# Patient Record
Sex: Male | Born: 1970 | Race: White | Hispanic: No | Smoking: Current every day smoker
Health system: Southern US, Community
[De-identification: ages and names within clinical notes are randomized; demographics above are authoritative.]

## PROBLEM LIST (undated history)

## (undated) DIAGNOSIS — F1910 Other psychoactive substance abuse, uncomplicated: Secondary | ICD-10-CM

## (undated) DIAGNOSIS — R079 Chest pain, unspecified: Secondary | ICD-10-CM

## (undated) DIAGNOSIS — I219 Acute myocardial infarction, unspecified: Secondary | ICD-10-CM

## (undated) DIAGNOSIS — R55 Syncope and collapse: Secondary | ICD-10-CM

---

## 1998-11-10 ENCOUNTER — Emergency Department (HOSPITAL_COMMUNITY): Admission: EM | Admit: 1998-11-10 | Discharge: 1998-11-10 | Payer: Self-pay | Admitting: Emergency Medicine

## 1998-11-10 ENCOUNTER — Encounter: Payer: Self-pay | Admitting: Emergency Medicine

## 1999-04-14 ENCOUNTER — Encounter: Payer: Self-pay | Admitting: Family Medicine

## 1999-04-14 ENCOUNTER — Ambulatory Visit (HOSPITAL_COMMUNITY): Admission: RE | Admit: 1999-04-14 | Discharge: 1999-04-14 | Payer: Self-pay | Admitting: *Deleted

## 2002-02-12 ENCOUNTER — Emergency Department (HOSPITAL_COMMUNITY): Admission: EM | Admit: 2002-02-12 | Discharge: 2002-02-12 | Payer: Self-pay | Admitting: Emergency Medicine

## 2002-11-27 ENCOUNTER — Emergency Department (HOSPITAL_COMMUNITY): Admission: EM | Admit: 2002-11-27 | Discharge: 2002-11-27 | Payer: Self-pay | Admitting: Emergency Medicine

## 2003-01-30 ENCOUNTER — Emergency Department (HOSPITAL_COMMUNITY): Admission: EM | Admit: 2003-01-30 | Discharge: 2003-01-30 | Payer: Self-pay | Admitting: Emergency Medicine

## 2003-01-30 ENCOUNTER — Encounter: Payer: Self-pay | Admitting: Emergency Medicine

## 2004-12-10 ENCOUNTER — Emergency Department (HOSPITAL_COMMUNITY): Admission: EM | Admit: 2004-12-10 | Discharge: 2004-12-10 | Payer: Self-pay | Admitting: Emergency Medicine

## 2006-12-26 ENCOUNTER — Emergency Department (HOSPITAL_COMMUNITY): Admission: EM | Admit: 2006-12-26 | Discharge: 2006-12-27 | Payer: Self-pay | Admitting: Emergency Medicine

## 2006-12-28 ENCOUNTER — Encounter: Payer: Self-pay | Admitting: Emergency Medicine

## 2006-12-29 ENCOUNTER — Inpatient Hospital Stay (HOSPITAL_COMMUNITY): Admission: EM | Admit: 2006-12-29 | Discharge: 2006-12-29 | Payer: Self-pay | Admitting: General Surgery

## 2008-07-12 IMAGING — CR DG CHEST 2V
3 series · 3 of 3 positions shown · non-contrast
Comparison: CT of 12/26/06.

CLINICAL DATA: 35 year-old with chest pain. Mid to right-sided chest pain, shortness of breath. Coughing up blood.
 CHEST - 2 VIEW:

[w chest lat (1 of 2)]
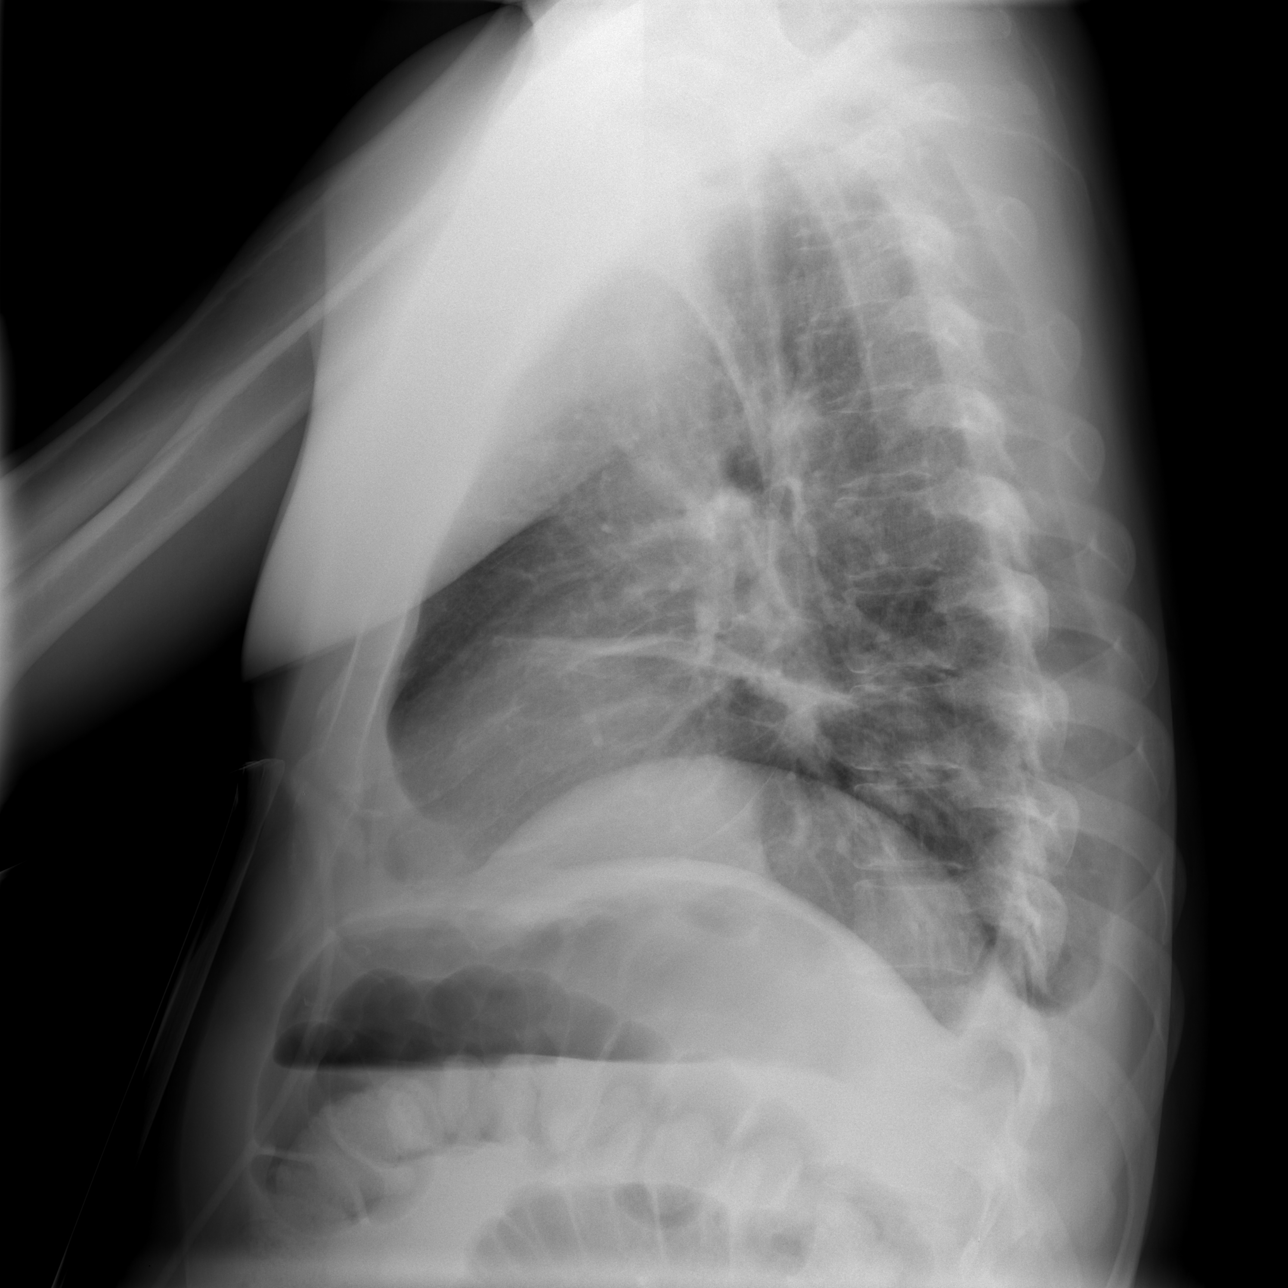

[w chest lat (2 of 2)]
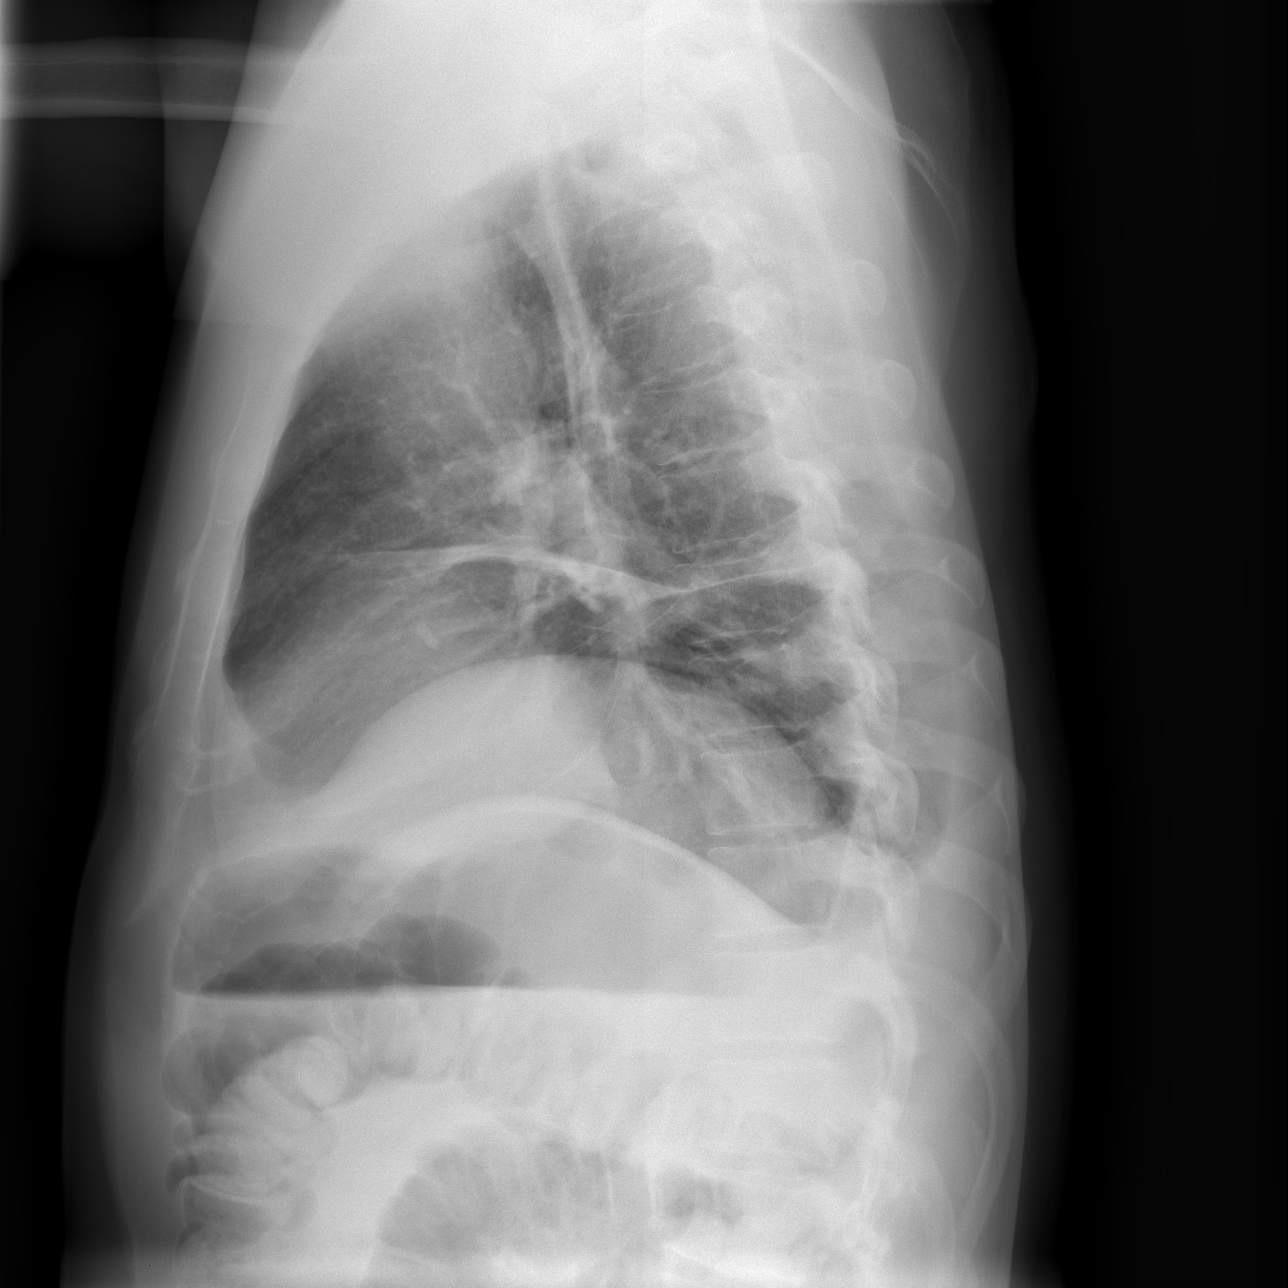

[view not recorded]
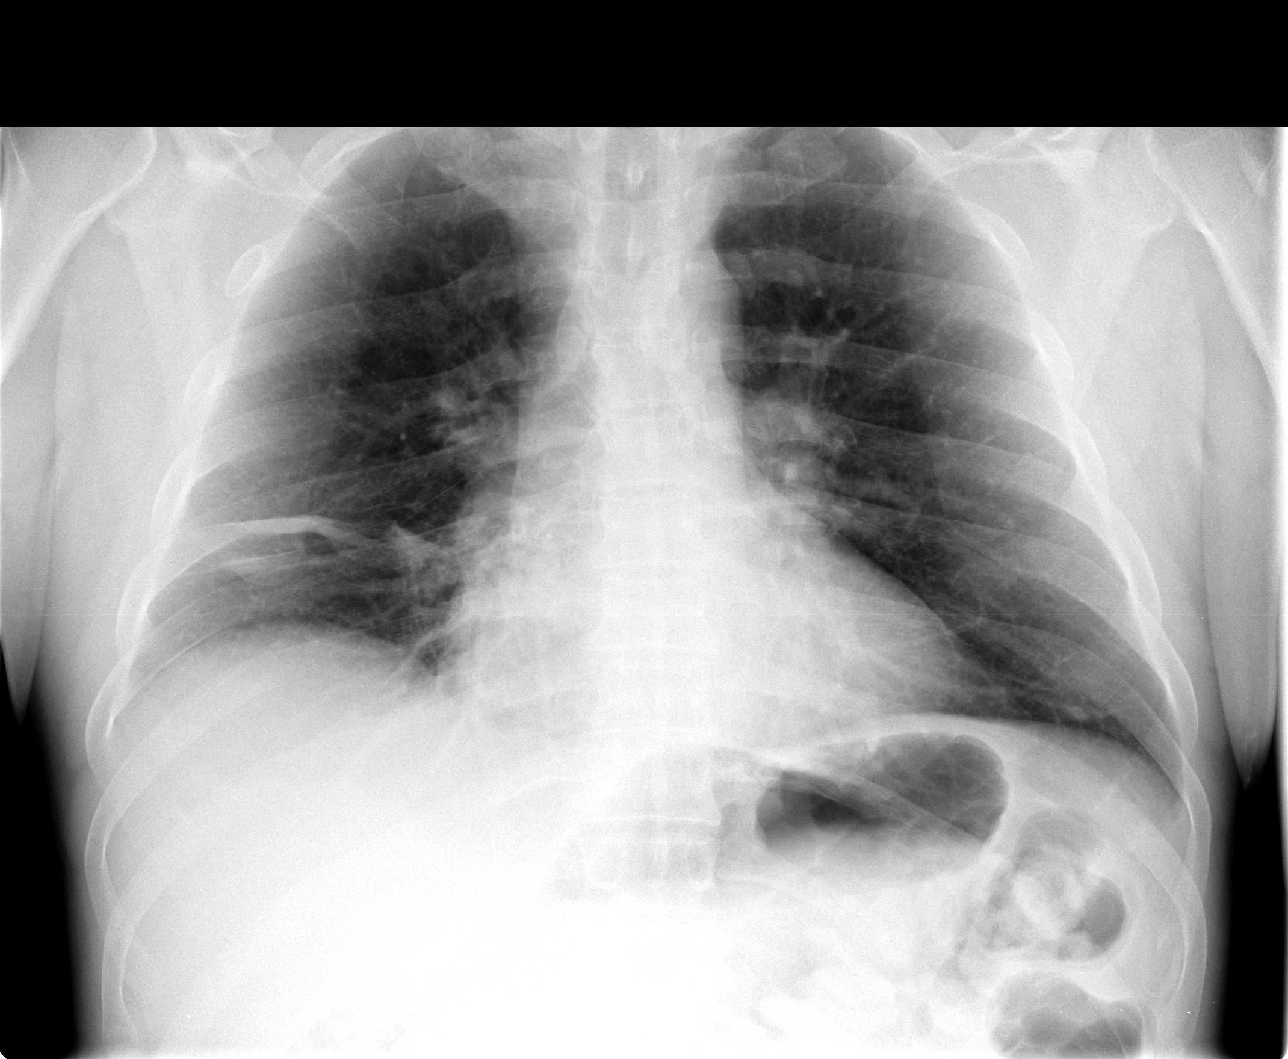

[3 of 3 positions shown; findings below may reference images not displayed]

FINDINGS: There is a fracture of the left anterior 1st rib.  There is no evidence for pneumothorax. There is significant right lung base subsegmental atelectasis. To a lesser degree, there is minimal left base atelectasis.
IMPRESSION: 1.  Fracture of the left anterior 1st rib.
 2.  Right lung base atelectasis.

## 2009-11-19 ENCOUNTER — Emergency Department (HOSPITAL_COMMUNITY): Admission: EM | Admit: 2009-11-19 | Discharge: 2009-11-19 | Payer: Self-pay | Admitting: Emergency Medicine

## 2010-03-21 ENCOUNTER — Emergency Department (HOSPITAL_COMMUNITY): Admission: EM | Admit: 2010-03-21 | Discharge: 2010-03-21 | Payer: Self-pay | Admitting: Emergency Medicine

## 2010-07-10 LAB — CBC
HCT: 45.5 % (ref 39.0–52.0)
MCH: 33.4 pg (ref 26.0–34.0)

## 2010-07-10 LAB — URINALYSIS, ROUTINE W REFLEX MICROSCOPIC
Ketones, ur: NEGATIVE mg/dL
Urobilinogen, UA: 2 mg/dL — ABNORMAL HIGH (ref 0.0–1.0)

## 2010-07-10 LAB — DIFFERENTIAL
Basophils Absolute: 0 10*3/uL (ref 0.0–0.1)
Eosinophils Relative: 1 % (ref 0–5)
Lymphs Abs: 1.5 10*3/uL (ref 0.7–4.0)

## 2010-07-10 LAB — BASIC METABOLIC PANEL
BUN: 13 mg/dL (ref 6–23)
CO2: 21 mEq/L (ref 19–32)
Glucose, Bld: 97 mg/dL (ref 70–99)
Potassium: 3.6 mEq/L (ref 3.5–5.1)

## 2010-09-07 NOTE — Discharge Summary (Signed)
Chase [name], Chase [name]               ACCOUNT NO.:  [id number]   MEDICAL RECORD NO.:  [id number]          PATIENT TYPE:  INP   LOCATION:  5707                         FACILITY:  [facility]   PHYSICIAN:  Velora Heckler, MD      DATE OF BIRTH:  15-Mar-1971   DATE OF ADMISSION:  12/29/2006  DATE OF DISCHARGE:                               DISCHARGE SUMMARY   DISCHARGE DIAGNOSES:  1. Status post crush injury to chest.  2. Left first rib fracture.  3. Left sternoclavicular dislocation.  4. History tobacco abuse.   HISTORY OF ADMISSION:  This is an otherwise healthy 40 year old male who  was working on a pickup on December 26, 2006, when it fell off the jack  and struck him in his chest.  His initial workup in the emergency room  showed a sternoclavicular dislocation on the left and a left first rib  fracture but no pneumothorax, and the patient was discharged home with  pain medication.  He returned to the emergency room, however, on the  evening of December 28, 2006, with continued complaints of pain not  relieved by the medications he was discharged on, concerns that he is  having some hemoptysis and also some hematuria.  He was seen by Dr.  Janee Morn and admitted for pain control and mobilization.  He had a  urinalysis done which was negative.  He had a chest x-ray which showed  no pneumothorax and some minimal atelectasis.  His hemoglobin/hematocrit  were stable at 14.1 and 40.1.  His white blood cell count was 9800 and  platelets were 268,000.  The patient was mobilized and his pain  medication was increased.  He did have a productive green sputum  production with his cough and was started empirically on Avelox for  bronchitis/early community-acquired pneumonia.  As noted, he remains  afebrile and does not have any significant leukocytosis.   Again, the patient was mobilized.  He is tolerating a regular diet and  at this point his pain is controlled, and he is discharged home with   family.   MEDICATIONS AT THE TIME OF DISCHARGE:  1. Percocet 10/325 one to two p.o. q.4h. p.r.n. pain #80, no refill.  2. Robaxin 500 mg one to two p.o. q.6h. p.r.n. muscle spasms #90, no      refill.  3. Phenergan 12.5 mg one tablet q.6h. p.r.n. nausea/vomiting #30, no      refill.  4. Avelox 400 mg one p.o. daily x4 more days.   The patient will be seen in followup in trauma clinic on January 11, 2007, 2:00 p.m.   DIET:  Regular.   ESTIMATE RETURN TO WORK:  In 3-4 weeks.  He does work as a Copywriter, advertising.      Shawn Rayburn, P.A.      Velora Heckler, MD  Electronically Signed    SR/MEDQ  D:  12/29/2006  T:  12/29/2006  Job:  [id number]   cc:   [facility]

## 2010-11-28 ENCOUNTER — Emergency Department (HOSPITAL_COMMUNITY): Payer: Worker's Compensation

## 2010-11-28 ENCOUNTER — Emergency Department (HOSPITAL_COMMUNITY)
Admission: EM | Admit: 2010-11-28 | Discharge: 2010-11-28 | Disposition: A | Discharge status: Home / Self Care | Payer: Worker's Compensation | Attending: Emergency Medicine | Admitting: Emergency Medicine

## 2010-11-28 DIAGNOSIS — M62838 Other muscle spasm: Secondary | ICD-10-CM | POA: Insufficient documentation

## 2010-11-28 DIAGNOSIS — R109 Unspecified abdominal pain: Secondary | ICD-10-CM | POA: Insufficient documentation

## 2010-11-28 DIAGNOSIS — T07XXXA Unspecified multiple injuries, initial encounter: Secondary | ICD-10-CM | POA: Insufficient documentation

## 2010-11-28 DIAGNOSIS — R10811 Right upper quadrant abdominal tenderness: Secondary | ICD-10-CM | POA: Insufficient documentation

## 2010-11-28 DIAGNOSIS — R071 Chest pain on breathing: Secondary | ICD-10-CM | POA: Insufficient documentation

## 2010-11-28 LAB — COMPREHENSIVE METABOLIC PANEL WITH GFR
ALT: 12 U/L (ref 0–53)
AST: 15 U/L (ref 0–37)
Albumin: 3.7 g/dL (ref 3.5–5.2)
Alkaline Phosphatase: 55 U/L (ref 39–117)
BUN: 13 mg/dL (ref 6–23)
CO2: 25 meq/L (ref 19–32)
Calcium: 8.7 mg/dL (ref 8.4–10.5)
Chloride: 106 meq/L (ref 96–112)
Creatinine, Ser: 0.82 mg/dL (ref 0.50–1.35)
GFR calc Af Amer: >60 mL/min
GFR calc non Af Amer: >60 mL/min
Glucose, Bld: 95 mg/dL (ref 70–99)
Potassium: 3.9 meq/L (ref 3.5–5.1)
Sodium: 138 meq/L (ref 135–145)
Total Bilirubin: 0.4 mg/dL (ref 0.3–1.2)
Total Protein: 6.2 g/dL (ref 6.0–8.3)

## 2010-11-28 LAB — DIFFERENTIAL
Basophils Absolute: 0 10*3/uL (ref 0.0–0.1)
Eosinophils Absolute: 0.3 10*3/uL (ref 0.0–0.7)
Lymphocytes Relative: 25 % (ref 12–46)
Monocytes Relative: 7 % (ref 3–12)
Neutrophils Relative %: 64 % (ref 43–77)

## 2010-11-28 LAB — CBC
HCT: 41.8 % (ref 39.0–52.0)
Hemoglobin: 15.1 g/dL (ref 13.0–17.0)
MCH: 33.5 pg (ref 26.0–34.0)
MCHC: 36.1 g/dL — ABNORMAL HIGH (ref 30.0–36.0)
MCV: 92.7 fL (ref 78.0–100.0)
Platelets: 246 K/uL (ref 150–400)
RBC: 4.51 MIL/uL (ref 4.22–5.81)
RDW: 12.8 % (ref 11.5–15.5)
WBC: 6.4 K/uL (ref 4.0–10.5)

## 2010-11-28 LAB — PROTIME-INR
INR: 0.91 (ref 0.00–1.49)
Prothrombin Time: 12.5 s (ref 11.6–15.2)

## 2010-11-28 MED ORDER — IOHEXOL 300 MG/ML  SOLN
75.0000 mL | Freq: Once | INTRAMUSCULAR | Status: AC | PRN
  Administered 2010-11-28: 75 mL via INTRAVENOUS

## 2010-12-06 ENCOUNTER — Emergency Department (HOSPITAL_COMMUNITY): Payer: Worker's Compensation

## 2010-12-06 ENCOUNTER — Emergency Department (HOSPITAL_COMMUNITY)
Admission: EM | Admit: 2010-12-06 | Discharge: 2010-12-06 | Disposition: A | Discharge status: Home / Self Care | Payer: Worker's Compensation | Attending: Emergency Medicine | Admitting: Emergency Medicine

## 2010-12-06 DIAGNOSIS — W1789XA Other fall from one level to another, initial encounter: Secondary | ICD-10-CM | POA: Insufficient documentation

## 2010-12-06 DIAGNOSIS — R079 Chest pain, unspecified: Secondary | ICD-10-CM | POA: Insufficient documentation

## 2010-12-15 ENCOUNTER — Emergency Department (HOSPITAL_COMMUNITY)
Admission: EM | Admit: 2010-12-15 | Discharge: 2010-12-15 | Disposition: A | Discharge status: Home / Self Care | Payer: Worker's Compensation | Attending: Emergency Medicine | Admitting: Emergency Medicine

## 2010-12-15 DIAGNOSIS — S199XXA Unspecified injury of neck, initial encounter: Secondary | ICD-10-CM | POA: Insufficient documentation

## 2010-12-15 DIAGNOSIS — S0993XA Unspecified injury of face, initial encounter: Secondary | ICD-10-CM | POA: Insufficient documentation

## 2010-12-15 DIAGNOSIS — Y990 Civilian activity done for income or pay: Secondary | ICD-10-CM | POA: Insufficient documentation

## 2010-12-15 DIAGNOSIS — R071 Chest pain on breathing: Secondary | ICD-10-CM | POA: Insufficient documentation

## 2010-12-15 DIAGNOSIS — W19XXXA Unspecified fall, initial encounter: Secondary | ICD-10-CM | POA: Insufficient documentation

## 2010-12-27 ENCOUNTER — Emergency Department (HOSPITAL_COMMUNITY)
Admission: EM | Admit: 2010-12-27 | Discharge: 2010-12-27 | Disposition: A | Discharge status: Home / Self Care | Payer: Worker's Compensation | Attending: Emergency Medicine | Admitting: Emergency Medicine

## 2010-12-27 DIAGNOSIS — R071 Chest pain on breathing: Secondary | ICD-10-CM | POA: Insufficient documentation

## 2010-12-27 DIAGNOSIS — Z09 Encounter for follow-up examination after completed treatment for conditions other than malignant neoplasm: Secondary | ICD-10-CM | POA: Insufficient documentation

## 2011-02-04 LAB — URINALYSIS, ROUTINE W REFLEX MICROSCOPIC
Bilirubin Urine: NEGATIVE
Bilirubin Urine: NEGATIVE
Glucose, UA: NEGATIVE
Hgb urine dipstick: NEGATIVE
Ketones, ur: NEGATIVE
Nitrite: NEGATIVE
Nitrite: NEGATIVE
Specific Gravity, Urine: 1.024
Urobilinogen, UA: 0.2
pH: 7

## 2011-02-04 LAB — COMPREHENSIVE METABOLIC PANEL
ALT: 16
AST: 19
Alkaline Phosphatase: 45
Chloride: 108
Creatinine, Ser: 0.99
GFR calc non Af Amer: >60
Sodium: 139
Total Bilirubin: 0.7
Total Protein: 6

## 2011-02-04 LAB — TROPONIN I: Troponin I: 0.03

## 2011-02-04 LAB — CBC
HCT: 41.3
HCT: 43.2
Hemoglobin: 14.1
Hemoglobin: 14.8
MCHC: 34.3
Platelets: 287
RBC: 4.28
RDW: 12.8

## 2011-02-04 LAB — DIFFERENTIAL
Basophils Absolute: 0.1
Eosinophils Absolute: 0.3
Eosinophils Relative: 2
Lymphocytes Relative: 15
Lymphs Abs: 1.9
Lymphs Abs: 1.5
Monocytes Absolute: 0.5
Monocytes Relative: 6
Neutrophils Relative %: 70

## 2011-02-04 LAB — CK TOTAL AND CKMB (NOT AT ARMC)
Relative Index: 1.4
Relative Index: 1.1
Total CK: 145

## 2011-02-04 LAB — BASIC METABOLIC PANEL
CO2: 24
Glucose, Bld: 108 — ABNORMAL HIGH
Potassium: 3.4 — ABNORMAL LOW
Sodium: 137

## 2011-02-04 LAB — RAPID URINE DRUG SCREEN, HOSP PERFORMED
Amphetamines: NOT DETECTED
Tetrahydrocannabinol: POSITIVE — AB

## 2012-06-20 IMAGING — CR DG CHEST 2V
2 series · 2 of 2 positions shown · non-contrast
Comparison: 11/28/2010; chest CT - 11/28/2010

CLINICAL DATA: Right-sided chest pain, history of prior right-sided
rib fractures.

CHEST - 2 VIEW

[w chest pa]
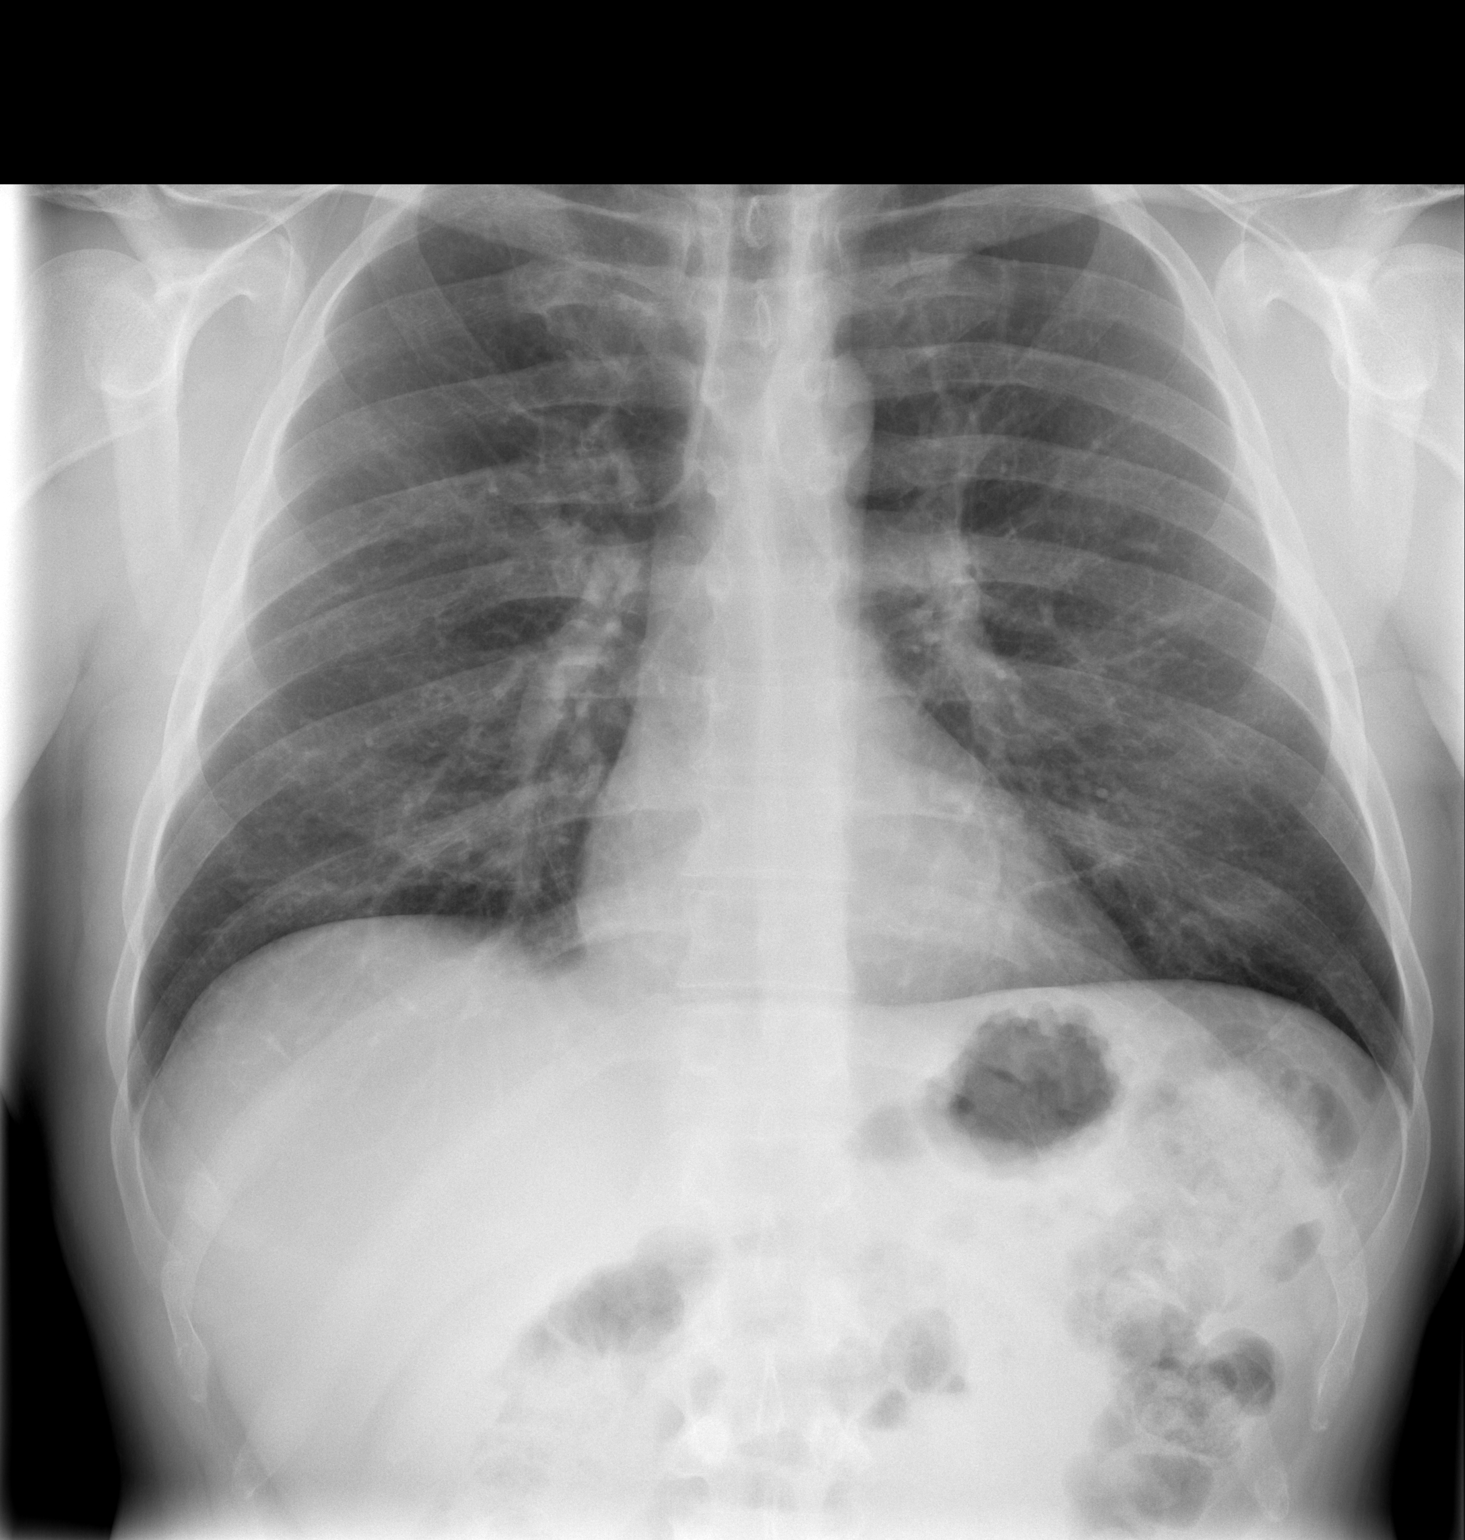

[w chest lat]
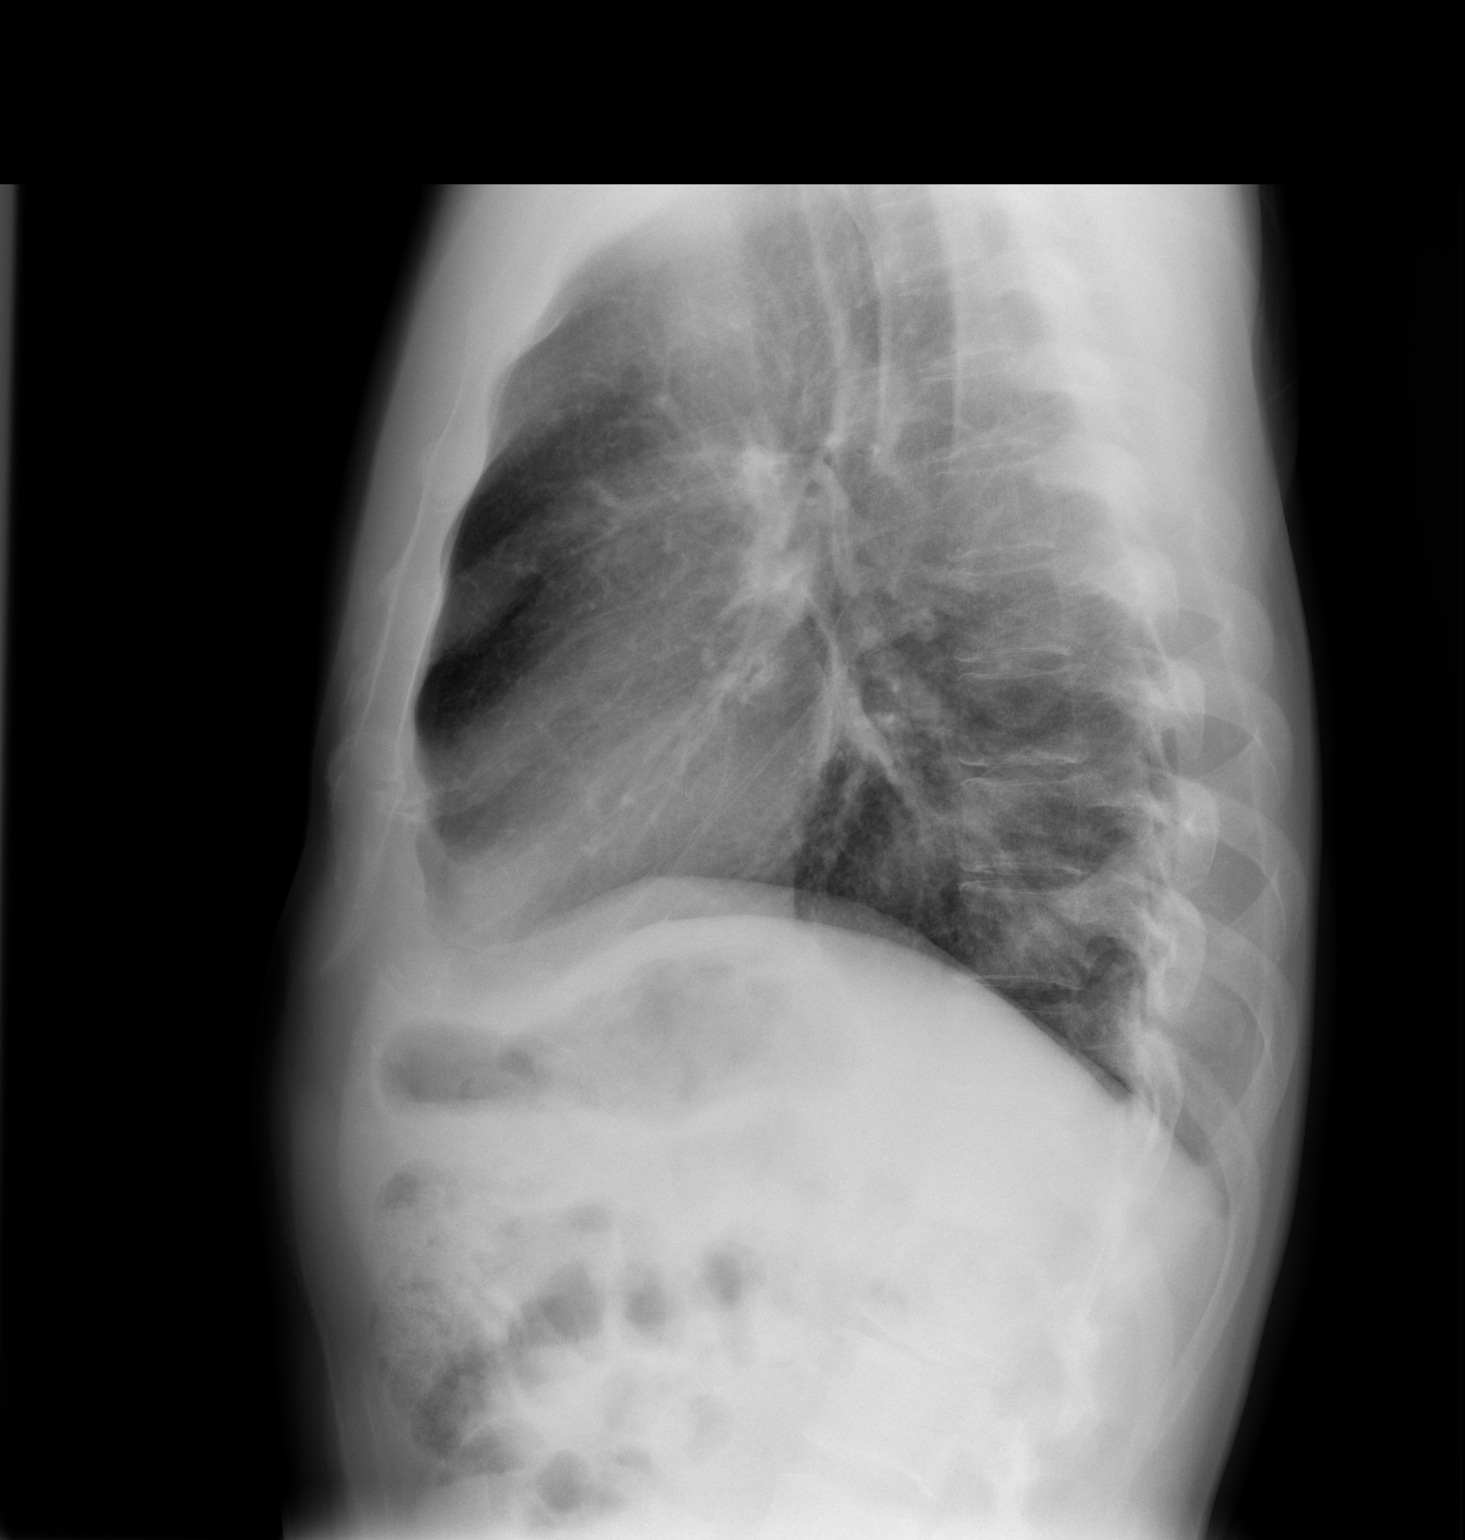

[2 of 2 positions shown; findings below may reference images not displayed]

FINDINGS: Unchanged cardiac silhouette and mediastinal contours.
No focal parenchymal opacities.  No pleural effusion or
pneumothorax.  Unchanged bones, specifically, no displaced right-
sided rib fractures.  Redemonstrated mild accentuation of lower
thoracic/upper Stich kyphosis without definite anterior compression
deformity.
IMPRESSION: No acute cardiopulmonary disease, specifically, no displaced right-
sided rib fractures.

## 2014-11-10 ENCOUNTER — Encounter (HOSPITAL_COMMUNITY): Payer: Self-pay | Admitting: *Deleted

## 2014-11-10 ENCOUNTER — Emergency Department (HOSPITAL_COMMUNITY): Payer: Worker's Compensation

## 2014-11-10 ENCOUNTER — Emergency Department (HOSPITAL_COMMUNITY)
Admission: EM | Admit: 2014-11-10 | Discharge: 2014-11-10 | Disposition: A | Discharge status: Home / Self Care | Payer: Worker's Compensation | Attending: Emergency Medicine | Admitting: Emergency Medicine

## 2014-11-10 DIAGNOSIS — R42 Dizziness and giddiness: Secondary | ICD-10-CM | POA: Insufficient documentation

## 2014-11-10 DIAGNOSIS — H6011 Cellulitis of right external ear: Secondary | ICD-10-CM

## 2014-11-10 DIAGNOSIS — R110 Nausea: Secondary | ICD-10-CM | POA: Insufficient documentation

## 2014-11-10 DIAGNOSIS — Z720 Tobacco use: Secondary | ICD-10-CM | POA: Insufficient documentation

## 2014-11-10 LAB — CBC WITH DIFFERENTIAL/PLATELET
BASOS ABS: 0 10*3/uL (ref 0.0–0.1)
Basophils Relative: 0 % (ref 0–1)
EOS ABS: 0.3 10*3/uL (ref 0.0–0.7)
EOS PCT: 4 % (ref 0–5)
HCT: 44.4 % (ref 39.0–52.0)
Hemoglobin: 15.3 g/dL (ref 13.0–17.0)
Lymphocytes Relative: 23 % (ref 12–46)
Lymphs Abs: 1.7 10*3/uL (ref 0.7–4.0)
MCH: 32.1 pg (ref 26.0–34.0)
MCHC: 34.5 g/dL (ref 30.0–36.0)
MCV: 93.3 fL (ref 78.0–100.0)
MONOS PCT: 6 % (ref 3–12)
Monocytes Absolute: 0.5 10*3/uL (ref 0.1–1.0)
NEUTROS PCT: 67 % (ref 43–77)
Neutro Abs: 4.8 10*3/uL (ref 1.7–7.7)
PLATELETS: 261 10*3/uL (ref 150–400)
RBC: 4.76 MIL/uL (ref 4.22–5.81)
RDW: 13.4 % (ref 11.5–15.5)
WBC: 7.3 10*3/uL (ref 4.0–10.5)

## 2014-11-10 LAB — BASIC METABOLIC PANEL
Anion gap: 7 (ref 5–15)
BUN: 13 mg/dL (ref 6–20)
CALCIUM: 9 mg/dL (ref 8.9–10.3)
CHLORIDE: 104 mmol/L (ref 101–111)
CO2: 26 mmol/L (ref 22–32)
CREATININE: 1.03 mg/dL (ref 0.61–1.24)
GLUCOSE: 97 mg/dL (ref 65–99)
POTASSIUM: 3.9 mmol/L (ref 3.5–5.1)
Sodium: 137 mmol/L (ref 135–145)

## 2014-11-10 MED ORDER — OXYCODONE-ACETAMINOPHEN 5-325 MG PO TABS: 1.0000 | ORAL_TABLET | Freq: Four times a day (QID) | ORAL | Status: DC | PRN

## 2014-11-10 MED ORDER — IOHEXOL 300 MG/ML  SOLN
75.0000 mL | Freq: Once | INTRAMUSCULAR | Status: AC | PRN
  Administered 2014-11-10: 75 mL via INTRAVENOUS

## 2014-11-10 MED ORDER — CEPHALEXIN 500 MG PO CAPS
500.0000 mg | ORAL_CAPSULE | Freq: Four times a day (QID) | ORAL | Status: DC
Start: 2014-11-10 — End: 2015-10-11

## 2014-11-10 MED ORDER — SULFAMETHOXAZOLE-TRIMETHOPRIM 800-160 MG PO TABS: 1.0000 | ORAL_TABLET | Freq: Two times a day (BID) | ORAL | Status: AC

## 2014-11-10 MED ORDER — OXYCODONE-ACETAMINOPHEN 5-325 MG PO TABS
2.0000 | ORAL_TABLET | Freq: Once | ORAL | Status: AC
  Administered 2014-11-10: 2 via ORAL
  Filled 2014-11-10: qty 2

## 2014-11-10 MED ORDER — FENTANYL CITRATE (PF) 100 MCG/2ML IJ SOLN: 50.0000 ug | Freq: Once | INTRAMUSCULAR | Status: DC

## 2014-11-10 MED ORDER — IBUPROFEN 800 MG PO TABS: 800.0000 mg | ORAL_TABLET | Freq: Three times a day (TID) | ORAL | Status: DC

## 2014-11-10 NOTE — ED Notes (Signed)
CT called and verified about patient's CT.

## 2014-11-10 NOTE — ED Provider Notes (Signed)
CSN: [id number]     Arrival date & time 11/10/14  1545 History  This chart was scribed for Chase ForthHannah Maxson Oddo, PA-C, working with Derwood KaplanAnkit Nanavati, MD by Elon SpannerGarrett Cook, ED Scribe. This patient was seen in room TR07C/TR07C and the patient's care was started at 4:33 PM.   Chief Complaint  Patient presents with  . Otalgia   The history is provided by the patient. No language interpreter was used.   HPI Comments: Chase [name] is a 44 y.o. male who presents to the Emergency Department complaining of worsening, severe pain in and around the right ear.  He also reports associated purulent/bloody drainage from the helix of the ear and phonophobia.  Additionally, he has had some lightheadedness and nausea onset this morning.  The pain is worsened with light touch and chewing.  He has taken aleve without relief.  The week prior the patient had an area of swelling and warmth on the right occipital region that resolved independently.  The patient has not been swimming recently, denies increased sun exposure.  There is no history of chronic medical conditions and takes no medications regularly.  He denies fever.     History reviewed. No pertinent past medical history. History reviewed. No pertinent past surgical history. No family history on file. History  Substance Use Topics  . Smoking status: Current Every Day Smoker -- 1.00 packs/day    Types: Cigarettes  . Smokeless tobacco: Not on file  . Alcohol Use: Yes    Review of Systems  Constitutional: Negative for fever, diaphoresis, appetite change, fatigue and unexpected weight change.  HENT: Positive for ear pain. Negative for mouth sores.   Eyes: Negative for visual disturbance.  Respiratory: Negative for cough, chest tightness, shortness of breath and wheezing.   Cardiovascular: Negative for chest pain.  Gastrointestinal: Positive for nausea. Negative for vomiting, abdominal pain, diarrhea and constipation.  Endocrine: Negative for polydipsia,  polyphagia and polyuria.  Genitourinary: Negative for dysuria, urgency, frequency and hematuria.  Musculoskeletal: Negative for back pain and neck stiffness.  Skin: Negative for rash.  Allergic/Immunologic: Negative for immunocompromised state.  Neurological: Positive for light-headedness. Negative for syncope and headaches.  Hematological: Does not bruise/bleed easily.  Psychiatric/Behavioral: Negative for sleep disturbance. The patient is not nervous/anxious.       Allergies  Review of patient's allergies indicates no known allergies.  Home Medications   Prior to Admission medications   Medication Sig Start Date End Date Taking? Authorizing Provider  cephALEXin (KEFLEX) 500 MG capsule Take 1 capsule (500 mg total) by mouth 4 (four) times daily. 11/10/14   Ryleigh Esqueda, PA-C  ibuprofen (ADVIL,MOTRIN) 800 MG tablet Take 1 tablet (800 mg total) by mouth 3 (three) times daily. 11/10/14   Kameren Baade, PA-C  oxyCODONE-acetaminophen (PERCOCET) 5-325 MG per tablet Take 1-2 tablets by mouth every 6 (six) hours as needed. 11/10/14   Ariyel Jeangilles, PA-C  sulfamethoxazole-trimethoprim (BACTRIM DS,SEPTRA DS) 800-160 MG per tablet Take 1 tablet by mouth 2 (two) times daily. 11/10/14 11/17/14  Emmali Karow, PA-C   BP 145/99 mmHg  Pulse 70  Temp(Src) 98.2 F (36.8 C) (Oral)  Resp 16  Wt 167 lb (75.751 kg)  SpO2 100% Physical Exam  Constitutional: He is oriented to person, place, and time. He appears well-developed and well-nourished. No distress.  HENT:  Head: Normocephalic and atraumatic.  Right Ear: Tympanic membrane and ear canal normal. There is drainage, swelling and tenderness. There is mastoid tenderness. Tympanic membrane is not injected, not scarred, not perforated  and not erythematous.  Left Ear: Tympanic membrane, external ear and ear canal normal.  Nose: No mucosal edema or rhinorrhea. No epistaxis. Right sinus exhibits no maxillary sinus tenderness and no  frontal sinus tenderness. Left sinus exhibits no maxillary sinus tenderness and no frontal sinus tenderness.  Mouth/Throat: Uvula is midline, oropharynx is clear and moist and mucous membranes are normal. Mucous membranes are not pale and not cyanotic. No oropharyngeal exudate, posterior oropharyngeal edema, posterior oropharyngeal erythema or tonsillar abscesses.  Right external ear and pinna is erythematous, swollen, ttp, with increased warmth, and induration.    Exquisite ttp of right mastoid.  No trismus.  No significant dental decay.    Eyes: Conjunctivae are normal. Pupils are equal, round, and reactive to light.  Neck: Normal range of motion and full passive range of motion without pain.  Cardiovascular: Normal rate, normal heart sounds and intact distal pulses.   No murmur heard. Pulmonary/Chest: Effort normal and breath sounds normal. No stridor.  Clear and equal breath sounds without focal wheezes, rhonchi, rales  Abdominal: Soft. Bowel sounds are normal. He exhibits no distension. There is no tenderness.  Musculoskeletal: Normal range of motion.  Lymphadenopathy:       Head (right side): Submandibular and tonsillar adenopathy present. No submental, no preauricular, no posterior auricular and no occipital adenopathy present.       Head (left side): No submental, no submandibular, no tonsillar, no preauricular, no posterior auricular and no occipital adenopathy present.    He has no cervical adenopathy.  Neurological: He is alert and oriented to person, place, and time.  Skin: Skin is warm and dry. No rash noted. He is not diaphoretic.  Psychiatric: He has a normal mood and affect.  Nursing note and vitals reviewed.   ED Course  Procedures (including critical care time)  DIAGNOSTIC STUDIES: Oxygen Saturation is 95% on RA, adeqaute by my interpretation.    COORDINATION OF CARE:  4:43 PM Discussed treatment plan with patient at bedside.  Patient acknowledges and agrees with  plan.    Labs Review Labs Reviewed  CBC WITH DIFFERENTIAL/PLATELET  BASIC METABOLIC PANEL    Imaging Review Ct Temporal Bones W/cm  11/10/2014   CLINICAL DATA:  Severe pain about the right ear. Infection of the pinna. Dizziness and nausea. Phonophobia.  EXAM: CT TEMPORAL BONES WITH CONTRAST  TECHNIQUE: Axial and coronal plane CT imaging of the petrous temporal bones was performed with thin-collimation image reconstruction after intravenous contrast administration. Multiplanar CT image reconstructions were also generated.  CONTRAST:  75mL OMNIPAQUE IOHEXOL 300 MG/ML  SOLN  COMPARISON:  Cervical spine CT 12/26/2006  FINDINGS: RIGHT: There is diffuse soft tissue thickening involving the external ear. No fluid collection is seen. The external auditory canal and tympanic membrane are unremarkable. The ossicles appear intact. The tympanic cavity is clear. There is osseous thinning versus dehiscence of the superior semicircular canal posteriorly. The internal auditory canal, cochlea, vestibule, and semicircular canals are otherwise unremarkable. The mastoid air cells are clear.  LEFT: The external auditory canal and tympanic membrane are unremarkable. The ossicles appear intact. The tympanic cavity is clear. There is osseous thinning versus dehiscence of the superior semicircular canal posteriorly. The internal auditory canal, cochlea, vestibule, and semicircular canals are otherwise unremarkable. The mastoid air cells are clear.  There is mild mucosal thickening in the right maxillary and left sphenoid sinuses. The visualized portion of the brain is unremarkable. Visualized orbits are unremarkable.  IMPRESSION: 1. Diffuse soft tissue thickening of the right  external ear, which may reflect cellulitis. 2. No evidence of mastoiditis. 3. Osseous thinning versus dehiscence of the superior semicircular canals bilaterally.   Electronically Signed   By: Sebastian Ache   On: 11/10/2014 20:12     EKG  Interpretation None      MDM   Final diagnoses:  Cellulitis of right external ear   GLYNDON TURSI presents with swelling, erythema and tenderness to the external ear including the cartilage.  Induration palpable along the helix of the external ear.  No palpable fluctuance to suggest abscess. Patient is significantly tender along the mastoid as well. Will obtain CT scan for evaluation of mastoiditis.  The patient was discussed with and seen by Dr. Rhunette Croft who agrees with the treatment plan.  8:36 PM CT with diffuse soft tissue thickening of the right external ear, which may reflect cellulitis.  No evidence of mastoiditis.  Will refer to ENT and begin antibiotics.    Discussed with pharmacy who agrees that Keflex and Bactrim are sufficient.    BP 145/99 mmHg  Pulse 70  Temp(Src) 98.2 F (36.8 C) (Oral)  Resp 16  Wt 167 lb (75.751 kg)  SpO2 100%  I personally performed the services described in this documentation, which was scribed in my presence. The recorded information has been reviewed and is accurate.    Dahlia Client Ashyia Schraeder, PA-C 11/10/14 [id number]  Derwood Kaplan, MD 11/10/14 [id number]

## 2014-11-10 NOTE — ED Notes (Signed)
Patient waiting for CT to result.

## 2014-11-10 NOTE — ED Notes (Signed)
Patient returned from CT

## 2014-11-10 NOTE — ED Notes (Signed)
PA made aware of the patient's pain.  

## 2014-11-10 NOTE — ED Notes (Signed)
Patient laughing and speaking with PA. Patient is talking.

## 2014-11-10 NOTE — Discharge Instructions (Signed)
1. Medications: percocet and ibuprofen for pain, Keflex and Bactrim as antibiotics, usual home medications 2. Treatment: rest, drink plenty of fluids, use warm compresses, keep ear clean with warm soap and water 3. Follow Up: Please followup with ENT 2 days for discussion of your diagnoses and further evaluation after today's visit; if you do not have a primary care doctor use the resource guide provided to find one; Please return to the ER if you are unable to see ENT in 2 days or you develop fevers, chills, nausea, vomiting or other signs of infection    Cellulitis Cellulitis is an infection of the skin and the tissue beneath it. The infected area is usually red and tender. Cellulitis occurs most often in the arms and lower legs.  CAUSES  Cellulitis is caused by bacteria that enter the skin through cracks or cuts in the skin. The most common types of bacteria that cause cellulitis are staphylococci and streptococci. SIGNS AND SYMPTOMS   Redness and warmth.  Swelling.  Tenderness or pain.  Fever. DIAGNOSIS  Your health care provider can usually determine what is wrong based on a physical exam. Blood tests may also be done. TREATMENT  Treatment usually involves taking an antibiotic medicine. HOME CARE INSTRUCTIONS   Take your antibiotic medicine as directed by your health care provider. Finish the antibiotic even if you start to feel better.  Keep the infected arm or leg elevated to reduce swelling.  Apply a warm cloth to the affected area up to 4 times per day to relieve pain.  Take medicines only as directed by your health care provider.  Keep all follow-up visits as directed by your health care provider. SEEK MEDICAL CARE IF:   You notice red streaks coming from the infected area.  Your red area gets larger or turns dark in color.  Your bone or joint underneath the infected area becomes painful after the skin has healed.  Your infection returns in the same area or another  area.  You notice a swollen bump in the infected area.  You develop new symptoms.  You have a fever. SEEK IMMEDIATE MEDICAL CARE IF:   You feel very sleepy.  You develop vomiting or diarrhea.  You have a general ill feeling (malaise) with muscle aches and pains. MAKE SURE YOU:   Understand these instructions.  Will watch your condition.  Will get help right away if you are not doing well or get worse. Document Released: 01/19/2005 Document Revised: 08/26/2013 Document Reviewed: 06/27/2011 [facility] Patient Information 2015 [place], [place]. This information is not intended to replace advice given to you by your health care provider. Make sure you discuss any questions you have with your health care provider.    Emergency Department Resource Guide 1) Find a Doctor and Pay Out of Pocket Although you won't have to find out who is covered by your insurance plan, it is a good idea to ask around and get recommendations. You will then need to call the office and see if the doctor you have chosen will accept you as a new patient and what types of options they offer for patients who are self-pay. Some doctors offer discounts or will set up payment plans for their patients who do not have insurance, but you will need to ask so you aren't surprised when you get to your appointment.  2) Contact Your Local Health Department Not all health departments have doctors that can see patients for sick visits, but many do, so it  is worth a call to see if yours does. If you don't know where your local health department is, you can check in your phone book. The CDC also has a tool to help you locate your state's health department, and many state websites also have listings of all of their local health departments.  3) Find a Walk-in Clinic If your illness is not likely to be very severe or complicated, you may want to try a walk in clinic. These are popping up all over the country in pharmacies, drugstores,  and shopping centers. They're usually staffed by nurse practitioners or physician assistants that have been trained to treat common illnesses and complaints. They're usually fairly quick and inexpensive. However, if you have serious medical issues or chronic medical problems, these are probably not your best option.  No Primary Care Doctor: - Call Health Connect at  [phone] - they can help you locate a primary care doctor that  accepts your insurance, provides certain services, etc. - Physician Referral Service- [phone]  Chronic Pain Problems: Organization         Address  Phone   Notes  [facility]  [phone] Patients need to be referred by their primary care doctor.   Medication Assistance: Organization         Address  Phone   Notes  [facility] Medication [facility] [address] [place], [place] [address] [phone] --Must be a resident of [facility] -- Must have NO insurance coverage whatsoever (no Medicaid/ Medicare, etc.) -- The pt. MUST have a primary care doctor that directs their care regularly and follows them in the community   [facility]  [phone]   [employer]  [phone]    Agencies that provide inexpensive medical care: Organization         Address  Phone   Notes  [facility]  [phone]   [facility]    [phone]   [facility] [address] [place], [place] [address] [phone]   [facility] [address] [place], [place] [phone]   Planned Parenthood    [phone]   [facility]    [phone]   Community Health and [facility]  [address], [place] Phone:  [phone], Fax:  [phone] Hours of Operation:  9 am - 6 pm, M-F.  Also accepts Medicaid/Medicare and self-pay.  [facility]  [address], [place] Phone: [phone], Fax: [phone]. Hours of Operation:  8:30 am - 5:30 pm, M-F.  Also accepts Medicaid and self-pay.  [facility] [place] [address], [place] Phone: [phone]   [facility] [address] Natasha Bence [place], [place] [phone], Ext. [phone] Mondays & Thursdays: 7-9 AM.  First 15 patients are seen on a first come, first serve basis.    Medicaid-accepting [facility] Providers:  Organization         Address  Phone   Notes  [facility] [address], [place] [phone] Also accepts self-pay patients.  [facility] [address] Laurell Josephs [place], [place]  [phone]   [facility] [address], [place] [phone]   [facility] [address] [place] [phone]   Renaye Rakers [address]  [address], [place]   [phone] Only accepts [place] patients after they have their name applied to their card.   Self-Pay (no insurance) in [facility]:  Organization         Address  Phone   Notes  Sickle Cell Patients, [facility] [address] [place], [place] [phone]   [facility] [address] [place], [place] [phone]   [facility]  [address], [place] [phone]   [facility]/Dr. Osei-Bonsu  [address], [place] or [address], [place] [phone] Phone number for both [place] and [place] locations is the same.  [facility]gent [facility] and [facility] [address], [place] [phone]   [facility] [address] [place] or [address] [phone]   [facility] [address], [place] [phone], phone; [phone], fax Sees patients 1st and 3rd Saturday of every month.  Must not qualify for public or  private insurance (i.e. Medicaid, Medicare, [place] Health Choice, Veterans' Benefits)  Household income should be no more than 200% of the poverty level The clinic cannot treat you if you are pregnant or think you are pregnant  Sexually transmitted diseases are not treated at the clinic.    Dental Care: Organization         Address  Phone  Notes  [facility] Department of [facility] [address] [place], [place] [phone] Accepts children up to age 29 who are enrolled in [place] or [place] Health Choice; pregnant women with a Medicaid card; and children who have applied for Medicaid or [place] Health Choice, but were declined, whose parents can pay a reduced fee at time of service.  [facility] of [facility]  [address], [place] [phone] Accepts children up to age 12 who are enrolled in [place] or [place] Health Choice; pregnant women with a Medicaid card; and children who have applied for Medicaid or  Health Choice, but were declined, whose parents can pay a reduced fee at time of service.  [facility]  [address] [place], [place] [phone] Patients are seen by appointment only. Walk-ins are not accepted. [facility] will see patients 68 years of age and older. Monday - Tuesday (8am-5pm) Most Wednesdays (8:30-5pm) $30 per visit, cash only  [facility] Adult [facility] Access [facility]  [address], [facility] [phone] Patients are seen by appointment only. Walk-ins are not accepted. [facility] will see patients 18 years of age and older. One Wednesday Evening (Monthly: Volunteer Based).  $30 per visit, cash only  [employer]  [phone] for adults; Children under age 86, call Graduate Pediatric Dentistry at [phone]. Children aged 38-14, please call [phone] to request a pediatric application.  Dental services are provided in all areas of dental  care including fillings, crowns and bridges, complete and partial dentures, implants, gum treatment, root canals, and extractions. Preventive care is also provided. Treatment is provided to both adults and children. Patients are selected via a lottery and there is often a waiting list.   [facility] [address] [place]  [phone] [email or web address]   [facility] [address] [place], [place] [phone], Ext. [phone] Second and Fourth Thursday  of each month, opens at 6:30 AM; Clinic ends at 9 AM.  Patients are seen on a first-come first-served basis, and a limited number are seen during each clinic.   [facility]  [address], [place], [place] [phone]   Eligibility Requirements You must have lived in [place], [place], or [place] counties for at least the last three months.   You cannot be eligible for state or federal sponsored [place]healthcare insurance, including [employer]IG[employer]eterans Administration, [place]icaid, or [employer]icare.   You generally cannot be eligible for healthcare insurance through your employer.    How to apply: Eligibility screenings are held every Tuesday and Wednesday afternoon from 1:00 pm until 4:00 pm. You do not need an appointment for the interview!  [facility] [address], [place], [place] [phone]   [facility]  [phone]   [facility]  [phone]   [facility]  [phone]    Behavioral Health Resources in the Community: Intensive Outpatient Programs Organization         Address  Phone  Notes  [facility] [address], [place], [place] [phone]   [facility] [address], [place], [place] [phone]   [facility] [address], [place], [place]  [phone]   [facility] [address],  [place], [place] [phone] or [phone]    Substance Abuse Resources Organization         Address  Phone  Notes  [facility]  [phone]   [facility]  [phone]   [facility]  [phone]   [facility]  [phone]   Residential & Outpatient Substance Abuse Program  [phone]   Psychological Services Organization         Address  Phone  Notes  [facility]  [phone]- [phone]   [facility]  [phone]- [phone]   [facility] [address], [place] [phone] or [phone]    Mobile Crisis Teams Organization         Address  Phone  Notes  [facility], Mobile Crisis Care Unit  [phone]   [facility]  [address] [place], [place] [phone]   Doristine LocksSharon DeEsch [address] [place] [phone]    Self-Help/Support Groups Organization         Address  Phone             Notes  [facility] [place] - variety of support groups  [phone]- [phone] Call for more information  Narcotics Anonymous (NA), [facility] [address], [place]  2 meetings at this location   Statisticianesidential Treatment Programs Organization         Address  Phone  Notes  [facility] [address],    [place]  [phone]   [facility]  [address], [place] [phone], [place], [place] [phone]   [facility] [address], [place] [phone] Admissions: 8am-3pm M-F  [facility] [address]    [place], [place] [phone]   [facility] [address], [place], [place] [phone]   [facility] [address]  [place], [place] [phone]   [facility] - Intensive Outpatient [address], [place], [place] [phone]   [facility] ([facility] [phone]  [place], [place] [phone] or [phone]   [facility] ([facility]) [address], [place], [place] [phone] Accepts Medicaid  [facility] [address]  [place] [phone] Substance Abuse/Addiction Treatment   [facility] Organization         Address  Phone  Notes  [facility]  [phone]   Angie Fava, PhD [address] Ervin Knack [place], [place]   [phone] or [phone]   [facility]   [address] [place], [place] [phone]   [facility] [address] [place], [place] [phone] Insurance/Medicaid/sponsorship through [facility] and Families [address]                                    [place], [place] [phone] Therapy/tele-psych/case  [facility] [address] [place] [address], [place] [phone]    Dr. Lolly Mustache  [phone]   [facility]  [employer] [facility] Dept. 1) [address] [place] 2) [address] [place] 3)  [address], [place] [phone]  [phone]   [facility] Child Abuse Hotline [phone] or [phone] (After Hours)

## 2014-11-10 NOTE — ED Notes (Signed)
Pt states that he has had ear pain swelling bleeding/drainage to cartilage. Pt states that he has had some nausea as well.

## 2015-10-11 ENCOUNTER — Emergency Department (HOSPITAL_COMMUNITY): Payer: Self-pay

## 2015-10-11 ENCOUNTER — Other Ambulatory Visit: Payer: Self-pay

## 2015-10-11 ENCOUNTER — Observation Stay (HOSPITAL_COMMUNITY)
Admission: EM | Admit: 2015-10-11 | Discharge: 2015-10-14 | Disposition: A | Discharge status: Home / Self Care | Payer: Self-pay | Attending: Internal Medicine | Admitting: Internal Medicine

## 2015-10-11 ENCOUNTER — Encounter (HOSPITAL_COMMUNITY): Payer: Self-pay | Admitting: *Deleted

## 2015-10-11 DIAGNOSIS — W1830XA Fall on same level, unspecified, initial encounter: Secondary | ICD-10-CM | POA: Insufficient documentation

## 2015-10-11 DIAGNOSIS — F1210 Cannabis abuse, uncomplicated: Secondary | ICD-10-CM | POA: Insufficient documentation

## 2015-10-11 DIAGNOSIS — S060X9A Concussion with loss of consciousness of unspecified duration, initial encounter: Secondary | ICD-10-CM | POA: Diagnosis present

## 2015-10-11 DIAGNOSIS — S060X1A Concussion with loss of consciousness of 30 minutes or less, initial encounter: Secondary | ICD-10-CM | POA: Insufficient documentation

## 2015-10-11 DIAGNOSIS — F17210 Nicotine dependence, cigarettes, uncomplicated: Secondary | ICD-10-CM | POA: Insufficient documentation

## 2015-10-11 DIAGNOSIS — F1910 Other psychoactive substance abuse, uncomplicated: Secondary | ICD-10-CM | POA: Diagnosis present

## 2015-10-11 DIAGNOSIS — R42 Dizziness and giddiness: Secondary | ICD-10-CM | POA: Insufficient documentation

## 2015-10-11 DIAGNOSIS — Z720 Tobacco use: Secondary | ICD-10-CM | POA: Diagnosis present

## 2015-10-11 DIAGNOSIS — R29818 Other symptoms and signs involving the nervous system: Secondary | ICD-10-CM | POA: Diagnosis present

## 2015-10-11 DIAGNOSIS — F1410 Cocaine abuse, uncomplicated: Secondary | ICD-10-CM | POA: Insufficient documentation

## 2015-10-11 DIAGNOSIS — K047 Periapical abscess without sinus: Secondary | ICD-10-CM | POA: Insufficient documentation

## 2015-10-11 DIAGNOSIS — S02401A Maxillary fracture, unspecified, initial encounter for closed fracture: Secondary | ICD-10-CM | POA: Insufficient documentation

## 2015-10-11 DIAGNOSIS — W19XXXA Unspecified fall, initial encounter: Secondary | ICD-10-CM

## 2015-10-11 DIAGNOSIS — S022XXA Fracture of nasal bones, initial encounter for closed fracture: Secondary | ICD-10-CM

## 2015-10-11 DIAGNOSIS — R110 Nausea: Secondary | ICD-10-CM | POA: Insufficient documentation

## 2015-10-11 DIAGNOSIS — R531 Weakness: Principal | ICD-10-CM

## 2015-10-11 DIAGNOSIS — S0081XA Abrasion of other part of head, initial encounter: Secondary | ICD-10-CM

## 2015-10-11 DIAGNOSIS — I639 Cerebral infarction, unspecified: Secondary | ICD-10-CM

## 2015-10-11 DIAGNOSIS — R29898 Other symptoms and signs involving the musculoskeletal system: Secondary | ICD-10-CM | POA: Diagnosis present

## 2015-10-11 DIAGNOSIS — R200 Anesthesia of skin: Secondary | ICD-10-CM | POA: Insufficient documentation

## 2015-10-11 LAB — I-STAT TROPONIN, ED: Troponin i, poc: 0 ng/mL (ref 0.00–0.08)

## 2015-10-11 LAB — CBC
HCT: 47.2 % (ref 39.0–52.0)
HEMOGLOBIN: 17.2 g/dL — AB (ref 13.0–17.0)
MCH: 32.9 pg (ref 26.0–34.0)
MCHC: 36.4 g/dL — AB (ref 30.0–36.0)
MCV: 90.2 fL (ref 78.0–100.0)
Platelets: 318 10*3/uL (ref 150–400)
RBC: 5.23 MIL/uL (ref 4.22–5.81)
RDW: 13.3 % (ref 11.5–15.5)
WBC: 10.1 10*3/uL (ref 4.0–10.5)

## 2015-10-11 LAB — I-STAT CHEM 8, ED
BUN: 23 mg/dL — ABNORMAL HIGH (ref 6–20)
CHLORIDE: 104 mmol/L (ref 101–111)
CREATININE: 1.1 mg/dL (ref 0.61–1.24)
Calcium, Ion: 1.13 mmol/L (ref 1.12–1.23)
Glucose, Bld: 97 mg/dL (ref 65–99)
HEMATOCRIT: 48 % (ref 39.0–52.0)
HEMOGLOBIN: 16.3 g/dL (ref 13.0–17.0)
POTASSIUM: 4.4 mmol/L (ref 3.5–5.1)
Sodium: 139 mmol/L (ref 135–145)
TCO2: 26 mmol/L (ref 0–100)

## 2015-10-11 LAB — DIFFERENTIAL
BASOS ABS: 0 10*3/uL (ref 0.0–0.1)
BASOS PCT: 0 %
EOS ABS: 0.4 10*3/uL (ref 0.0–0.7)
Eosinophils Relative: 4 %
Lymphocytes Relative: 21 %
Lymphs Abs: 2.1 10*3/uL (ref 0.7–4.0)
Monocytes Absolute: 0.8 10*3/uL (ref 0.1–1.0)
Monocytes Relative: 8 %
NEUTROS PCT: 67 %
Neutro Abs: 6.8 10*3/uL (ref 1.7–7.7)

## 2015-10-11 LAB — COMPREHENSIVE METABOLIC PANEL
ALBUMIN: 4.7 g/dL (ref 3.5–5.0)
ALT: 29 U/L (ref 17–63)
AST: 25 U/L (ref 15–41)
Alkaline Phosphatase: 57 U/L (ref 38–126)
Anion gap: 9 (ref 5–15)
BUN: 19 mg/dL (ref 6–20)
CHLORIDE: 106 mmol/L (ref 101–111)
CO2: 23 mmol/L (ref 22–32)
Calcium: 9.3 mg/dL (ref 8.9–10.3)
Creatinine, Ser: 1.21 mg/dL (ref 0.61–1.24)
GFR calc Af Amer: >60 mL/min (ref >60)
GFR calc non Af Amer: >60 mL/min (ref >60)
GLUCOSE: 105 mg/dL — AB (ref 65–99)
POTASSIUM: 4.1 mmol/L (ref 3.5–5.1)
Sodium: 138 mmol/L (ref 135–145)
Total Bilirubin: 0.7 mg/dL (ref 0.3–1.2)
Total Protein: 7.8 g/dL (ref 6.5–8.1)

## 2015-10-11 LAB — PROTIME-INR
INR: 0.97 (ref 0.00–1.49)
Prothrombin Time: 12.7 seconds (ref 11.6–15.2)

## 2015-10-11 LAB — APTT: APTT: 33 s (ref 24–37)

## 2015-10-11 LAB — ETHANOL

## 2015-10-11 MED ORDER — IOPAMIDOL (ISOVUE-370) INJECTION 76%
100.0000 mL | Freq: Once | INTRAVENOUS | Status: AC | PRN
  Administered 2015-10-11: 100 mL via INTRAVENOUS

## 2015-10-11 MED ORDER — FENTANYL CITRATE (PF) 100 MCG/2ML IJ SOLN
50.0000 ug | Freq: Once | INTRAMUSCULAR | Status: AC
  Administered 2015-10-11: 50 ug via INTRAVENOUS
  Filled 2015-10-11: qty 2

## 2015-10-11 NOTE — ED Notes (Signed)
Patient transported to MRI 
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
## 2015-10-11 NOTE — ED Notes (Signed)
Pt has improvement in sensation to right side of body but right leg is still showing no movement with elevation.

## 2015-10-11 NOTE — ED Notes (Signed)
Dr.Pickering at bedside. Pt reports to falling last night and has had progressive right sided weakness since that time. No movement of right leg. Pt able to move right arm but weak grip strength. Clear speech at this time and answering questions appropriately.

## 2015-10-11 NOTE — ED Notes (Signed)
Consult to Neurology NT,Tricia Pledger.
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
## 2015-10-12 ENCOUNTER — Encounter (HOSPITAL_COMMUNITY): Payer: Self-pay | Admitting: Family Medicine

## 2015-10-12 DIAGNOSIS — S060X1A Concussion with loss of consciousness of 30 minutes or less, initial encounter: Secondary | ICD-10-CM

## 2015-10-12 DIAGNOSIS — Z720 Tobacco use: Secondary | ICD-10-CM | POA: Diagnosis present

## 2015-10-12 DIAGNOSIS — S022XXA Fracture of nasal bones, initial encounter for closed fracture: Secondary | ICD-10-CM

## 2015-10-12 DIAGNOSIS — M6289 Other specified disorders of muscle: Secondary | ICD-10-CM

## 2015-10-12 DIAGNOSIS — R29898 Other symptoms and signs involving the musculoskeletal system: Secondary | ICD-10-CM | POA: Diagnosis present

## 2015-10-12 DIAGNOSIS — R29818 Other symptoms and signs involving the nervous system: Secondary | ICD-10-CM | POA: Diagnosis present

## 2015-10-12 DIAGNOSIS — R531 Weakness: Secondary | ICD-10-CM | POA: Diagnosis present

## 2015-10-12 DIAGNOSIS — S060X9A Concussion with loss of consciousness of unspecified duration, initial encounter: Secondary | ICD-10-CM | POA: Diagnosis present

## 2015-10-12 DIAGNOSIS — F1910 Other psychoactive substance abuse, uncomplicated: Secondary | ICD-10-CM

## 2015-10-12 LAB — RAPID URINE DRUG SCREEN, HOSP PERFORMED
AMPHETAMINES: NOT DETECTED
BARBITURATES: NOT DETECTED
Benzodiazepines: NOT DETECTED
Cocaine: POSITIVE — AB
Opiates: NOT DETECTED
TETRAHYDROCANNABINOL: POSITIVE — AB

## 2015-10-12 LAB — URINALYSIS, ROUTINE W REFLEX MICROSCOPIC
BILIRUBIN URINE: NEGATIVE
Glucose, UA: NEGATIVE mg/dL
Hgb urine dipstick: NEGATIVE
KETONES UR: NEGATIVE mg/dL
Leukocytes, UA: NEGATIVE
NITRITE: NEGATIVE
PROTEIN: NEGATIVE mg/dL
Specific Gravity, Urine: 1.044 — ABNORMAL HIGH (ref 1.005–1.030)
pH: 6 (ref 5.0–8.0)

## 2015-10-12 LAB — GLUCOSE, CAPILLARY: GLUCOSE-CAPILLARY: 103 mg/dL — AB (ref 65–99)

## 2015-10-12 MED ORDER — SODIUM CHLORIDE 0.9% FLUSH: 3.0000 mL | INTRAVENOUS | Status: DC | PRN

## 2015-10-12 MED ORDER — DIPHENHYDRAMINE HCL 25 MG PO CAPS
50.0000 mg | ORAL_CAPSULE | Freq: Once | ORAL | Status: AC
  Administered 2015-10-12: 50 mg via ORAL
  Filled 2015-10-12: qty 2

## 2015-10-12 MED ORDER — ONDANSETRON HCL 4 MG/2ML IJ SOLN
4.0000 mg | Freq: Once | INTRAMUSCULAR | Status: AC
  Administered 2015-10-12: 4 mg via INTRAVENOUS
  Filled 2015-10-12: qty 2

## 2015-10-12 MED ORDER — ACETAMINOPHEN 325 MG PO TABS: 650.0000 mg | ORAL_TABLET | Freq: Four times a day (QID) | ORAL | Status: DC | PRN

## 2015-10-12 MED ORDER — HEPARIN SODIUM (PORCINE) 5000 UNIT/ML IJ SOLN
5000.0000 [IU] | Freq: Three times a day (TID) | INTRAMUSCULAR | Status: DC
  Administered 2015-10-12 – 2015-10-14 (×4): 5000 [IU] via SUBCUTANEOUS
  Filled 2015-10-12 (×5): qty 1

## 2015-10-12 MED ORDER — ACETAMINOPHEN 650 MG RE SUPP: 650.0000 mg | Freq: Four times a day (QID) | RECTAL | Status: DC | PRN

## 2015-10-12 MED ORDER — ONDANSETRON HCL 4 MG PO TABS: 4.0000 mg | ORAL_TABLET | Freq: Four times a day (QID) | ORAL | Status: DC | PRN

## 2015-10-12 MED ORDER — MORPHINE SULFATE (PF) 4 MG/ML IV SOLN
4.0000 mg | Freq: Once | INTRAVENOUS | Status: AC
  Administered 2015-10-12: 4 mg via INTRAVENOUS
  Filled 2015-10-12: qty 1

## 2015-10-12 MED ORDER — ZOLPIDEM TARTRATE 5 MG PO TABS
5.0000 mg | ORAL_TABLET | Freq: Once | ORAL | Status: AC
  Administered 2015-10-12: 5 mg via ORAL
  Filled 2015-10-12: qty 1

## 2015-10-12 MED ORDER — HYDROCODONE-ACETAMINOPHEN 5-325 MG PO TABS
1.0000 | ORAL_TABLET | ORAL | Status: DC | PRN
  Administered 2015-10-12 – 2015-10-13 (×4): 2 via ORAL
  Administered 2015-10-14 (×2): 1 via ORAL
  Filled 2015-10-12 (×3): qty 2
  Filled 2015-10-12: qty 1
  Filled 2015-10-12: qty 2
  Filled 2015-10-12: qty 1

## 2015-10-12 MED ORDER — HYDROCODONE-ACETAMINOPHEN 5-325 MG PO TABS
1.0000 | ORAL_TABLET | ORAL | Status: DC | PRN
  Administered 2015-10-12: 2 via ORAL
  Administered 2015-10-12: 1 via ORAL
  Administered 2015-10-12: 2 via ORAL
  Filled 2015-10-12: qty 1
  Filled 2015-10-12 (×2): qty 2

## 2015-10-12 MED ORDER — SODIUM CHLORIDE 0.9 % IV SOLN: 250.0000 mL | INTRAVENOUS | Status: DC | PRN

## 2015-10-12 MED ORDER — SODIUM CHLORIDE 0.9% FLUSH
3.0000 mL | Freq: Two times a day (BID) | INTRAVENOUS | Status: DC
  Administered 2015-10-12 – 2015-10-14 (×4): 3 mL via INTRAVENOUS

## 2015-10-12 MED ORDER — POLYETHYLENE GLYCOL 3350 17 G PO PACK
17.0000 g | PACK | Freq: Every day | ORAL | Status: DC | PRN
  Administered 2015-10-13: 17 g via ORAL
  Filled 2015-10-12 (×2): qty 1

## 2015-10-12 MED ORDER — ONDANSETRON HCL 4 MG/2ML IJ SOLN
4.0000 mg | Freq: Four times a day (QID) | INTRAMUSCULAR | Status: DC | PRN
  Administered 2015-10-12 – 2015-10-13 (×3): 4 mg via INTRAVENOUS
  Filled 2015-10-12 (×3): qty 2
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
## 2015-10-12 NOTE — ED Notes (Signed)
Pt back from MRI 
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
## 2015-10-12 NOTE — ED Notes (Signed)
Hospitalist at bedside to evaluate pt.

## 2015-10-12 NOTE — Consult Note (Signed)
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 indicated at this point.  C.R. Nicole Kindred, [place] [facility] [phone]  10/12/2015, 8:20 PM

## 2015-10-12 NOTE — ED Notes (Signed)
Pt reports no change in pain on face. Pt still having weakness to right hand but able to lift without difficulty. Right leg still falls and no resistance when elevated.

## 2015-10-12 NOTE — Evaluation (Signed)
Physical Therapy Evaluation Patient Details Name: Chase [name] MRN: [name] DOB: 04/08/1971 Today's Date: 10/12/2015   History of Present Illness  Chase SimplerChadwick W [name] is a 45 y.o. male with medical history significant for polysubstance abuse who presents to the ED with dizziness, nausea, and right sided weakness after falling the day prior out of a truck striking his head on pavement, and losing consciousness for 2-3 minutes.    Clinical Impression  Pt admitted with above diagnosis. Pt currently with functional limitations due to the deficits listed below (see PT Problem List). Pt will benefit from skilled PT to increase their independence and safety with mobility to allow discharge to the venue listed below.  Pt with R LE weakness and reports of pain all over body.  Pt was able to stand with MIN/MOD A and take side steps.  He had episode of unresponsiveness after c/o R eye blurriness and had to be returned supine.  Vitals were stable and nursing entered into flowsheet.  Will need further assessment of his gait at next session.     Follow Up Recommendations Home health PT;Supervision for mobility/OOB (depending on progress)    Equipment Recommendations  Rolling walker with 5" wheels    Recommendations for Other Services       Precautions / Restrictions Precautions Precautions: Fall Precaution Comments: monitor BP      Mobility  Bed Mobility Overal bed mobility: Needs Assistance Bed Mobility: Supine to Sit;Sit to Supine     Supine to sit: Min guard Sit to supine: Min guard   General bed mobility comments: Pt demonstrated supine <> sit with min/guard and heavy use of rail with L UE and using L LE to move R LE  Transfers Overall transfer level: Needs assistance Equipment used: Rolling walker (2 wheeled) Transfers: Sit to/from Stand Sit to Stand: Min assist;From elevated surface;Mod assist         General transfer comment: Pt stood on 2nd attempt with MIN/MOD A and cueing  for proper positioning of LE and UE.  Ambulation/Gait Ambulation/Gait assistance: Min guard Ambulation Distance (Feet): 3 Feet Assistive device: Rolling walker (2 wheeled) Gait Pattern/deviations: Step-to pattern     General Gait Details: Side stepping with RW in front of bed due to pt stating he was feeling weak.  Pt able to side step to the R with heavy use of RW.  Pt then stated his R eye was blurry and became unresponsive.  PT able to get pt to sit and return him to supine.  Unresponsive to name staring into space.  Pt awakened to sternal rub and nurse in to assess pt.  Vitals stable and she entered into chart.  Stairs            Wheelchair Mobility    Modified Rankin (Stroke Patients Only) Modified Rankin (Stroke Patients Only) Pre-Morbid Rankin Score: No symptoms Modified Rankin: Moderately severe disability     Balance Overall balance assessment: Needs assistance;History of Falls (2 falls since intial fall when he struck head) Sitting-balance support: Feet supported Sitting balance-Leahy Scale: Fair     Standing balance support: Bilateral upper extremity supported Standing balance-Leahy Scale: Poor                               Pertinent Vitals/Pain Pain Assessment: 0-10 Pain Score: 9  Pain Location: face    Home Living Family/patient expects to be discharged to:: Private residence Living Arrangements: Children  Type of Home: House Home Access: Stairs to enter Entrance Stairs-Rails: Right;Left;Can reach both Entrance Stairs-Number of Steps: 5 Home Layout: One level        Prior Function Level of Independence: Independent         Comments: Oncologist Dominance   Dominant Hand: Right    Extremity/Trunk Assessment   Upper Extremity Assessment: Defer to OT evaluation           Lower Extremity Assessment: RLE deficits/detail RLE Deficits / Details: ankle df 2-/5, hip flex 2-/5, quads 2-/5. hip abd- state he could  not move in bed (gravity eliminated), but was able to side step in standing with RW- pt reporting pain with MMTs and mobility    Cervical / Trunk Assessment: Normal  Communication   Communication: No difficulties  Cognition Arousal/Alertness: Awake/alert Behavior During Therapy: WFL for tasks assessed/performed Overall Cognitive Status: Within Functional Limits for tasks assessed                      General Comments General comments (skin integrity, edema, etc.): Pt with abrasions on face and legs.     Exercises        Assessment/Plan    PT Assessment Patient needs continued PT services  PT Diagnosis Difficulty walking;Generalized weakness;Acute pain   PT Problem List Decreased strength;Decreased balance;Decreased mobility;Decreased knowledge of use of DME  PT Treatment Interventions DME instruction;Gait training;Stair training;Functional mobility training;Therapeutic activities;Therapeutic exercise;Balance training;Neuromuscular re-education   PT Goals (Current goals can be found in the Care Plan section) Acute Rehab PT Goals Patient Stated Goal: To get stronger PT Goal Formulation: With patient Time For Goal Achievement: 10/19/15 Potential to Achieve Goals: Good    Frequency Min 3X/week   Barriers to discharge Decreased caregiver support Lives with 9 and 25 y/o children.  girlfriend around but works and goes to school    Co-evaluation               End of Journalist, newspaper During Treatment: Gait belt Activity Tolerance: Treatment limited secondary to medical complications (Comment) Patient left: in bed;with call bell/phone within reach;with bed alarm set;with nursing/sitter in room;with family/visitor present Nurse Communication: Mobility status;Other (comment) (episode of unresponsiveness)    Functional Assessment Tool Used: clinical judgement and objective findings Functional Limitation: Mobility: Walking and moving around Mobility: Walking  and Moving Around Current Status [phone]): At least 20 percent but less than 40 percent impaired, limited or restricted Mobility: Walking and Moving Around Goal Status [phone]): At least 1 percent but less than 20 percent impaired, limited or restricted    Time: 0931-1000 PT Time Calculation (min) (ACUTE ONLY): 29 min   Charges:   PT Evaluation $PT Eval Moderate Complexity: 1 Procedure PT Treatments $Therapeutic Activity: 8-22 mins   PT G Codes:   PT G-Codes **NOT FOR INPATIENT CLASS** Functional Assessment Tool Used: clinical judgement and objective findings Functional Limitation: Mobility: Walking and moving around Mobility: Walking and Moving Around Current Status (U9811): At least 20 percent but less than 40 percent impaired, limited or restricted Mobility: Walking and Moving Around Goal Status [phone]): At least 1 percent but less than 20 percent impaired, limited or restricted    [facility] LUBECK 10/12/2015, 10:23 AM

## 2015-10-12 NOTE — ED Notes (Signed)
Pt still in MRI at this time

## 2015-10-12 NOTE — Progress Notes (Signed)
OT Cancellation Note  Patient Details Name: Chase [name] MRN: [id number] DOB: 08/15/1970   Cancelled Treatment:    Reason Eval/Treat Not Completed: Other (comment)  Noted pt passed out during PT eval this this morning- will check on pt later in day or next day for OT eval as schedule allows  Lise AuerLori Shaunice Levitan, OT [phone]  Einar CrowEDDING, Gwenneth Whiteman D 10/12/2015, 10:50 AM

## 2015-10-13 DIAGNOSIS — R29898 Other symptoms and signs involving the musculoskeletal system: Secondary | ICD-10-CM

## 2015-10-13 DIAGNOSIS — S0081XA Abrasion of other part of head, initial encounter: Secondary | ICD-10-CM

## 2015-10-13 LAB — GLUCOSE, CAPILLARY: GLUCOSE-CAPILLARY: 111 mg/dL — AB (ref 65–99)

## 2015-10-13 MED ORDER — BENZOCAINE 10 % MT GEL
Freq: Two times a day (BID) | OROMUCOSAL | Status: DC | PRN
  Administered 2015-10-13: 1 via OROMUCOSAL
  Filled 2015-10-13: qty 9.4

## 2015-10-13 NOTE — Progress Notes (Signed)
PT Cancellation Note  Patient Details Name: Viviana SimplerChadwick W Stickels MRN: [id number] DOB: 09/17/1970   Cancelled Treatment:    Reason Eval/Treat Not Completed: Patient declined, stating he didn't sleep and was nauseous and that he just wanted to go home.  PT and OT were in middle of explaining purpose of therapy in assessing mobility to assist with dc home when pt took a cell phone call and spoke for several minutes and then told therapists that it would have to wait and he needed to take care of business.  Nursing informed of refusal and nausea.    Esiah Bazinet LUBECK 10/13/2015, 9:10 AM

## 2015-10-13 NOTE — Progress Notes (Signed)
Occupational Therapy Evaluation Patient Details Name: Chase [name] MRN: [id number] DOB: [birth year] Today's Date: 10/13/2015    History of Present Illness Chase [name] is a 45 y.o. male with medical history significant for polysubstance abuse who presents to the ED with dizziness, nausea, and right sided weakness after falling the day prior out of a truck striking his head on pavement, and losing consciousness for 2-3 minutes.   Clinical Impression   Patient presents to OT with poor effort on ROM/strength testing of RUE, but good use of RUE during functional activities; also has impaired safety/judgment and problem solving. He requires +2 assistance for mobility at this time due to decreased balance. He nearly fell twice during session. With only his 14yo son to assist at home, he is currently a HIGH FALL RISK and not safe to d/c home. Recommend SNF rehab at discharge.    Follow Up Recommendations  SNF;Supervision/Assistance - 24 hour    Equipment Recommendations  3 in 1 bedside comode;Tub/shower bench    Recommendations for Other Services       Precautions / Restrictions Precautions Precautions: Fall Precaution Comments: monitor BP Restrictions Weight Bearing Restrictions: No      Mobility Bed Mobility Overal bed mobility: Needs Assistance Bed Mobility: Supine to Sit;Sit to Supine     Supine to sit: Min guard Sit to supine: Min assist   General bed mobility comments: heavy use of rail, assistance with RLE into bed  Transfers Overall transfer level: Needs assistance Equipment used: Rolling walker (2 wheeled) Transfers: Sit to/from Stand Sit to Stand: Mod assist;+2 physical assistance;+2 safety/equipment;From elevated surface              Balance                                            ADL Overall ADL's : Needs assistance/impaired     Grooming: Wash/dry hands;Oral care;Minimal assistance;Standing Grooming Details (indicate cue  type and reason): min A for balance only; no difficulty brushing teeth or washing hands         Upper Body Dressing : Minimal assistance;Bed level   Lower Body Dressing: Moderate assistance;Bed level   Toilet Transfer: Moderate assistance;+2 for physical assistance;+2 for safety/equipment;Ambulation;Comfort height toilet;RW;Grab bars Toilet Transfer Details (indicate cue type and reason): however, patient stood from toilet and pulled up pants without therapist assistance, even though he was told not to stand without assistance Toileting- Clothing Manipulation and Hygiene: Minimal assistance;Sit to/from stand       Functional mobility during ADLs: Moderate assistance;+2 for physical assistance;+2 for safety/equipment;Rolling walker;Cueing for safety;Cueing for sequencing General ADL Comments: Difficult to figure patient out. Poor effort with ROM/strength testing, but functionally able to use RUE with no difficulty. Observed patient holding phone, brushing teeth, participating in toileting/grooming/dressing, and utilizing RW with RUE with adequate strength/ROM. Patient needs heavy assistance and max cues for all mobility tasks and has difficulty following commands. Observed patient ambulating with RW at times needing min A, then would suddenly "fall" forward but then could recover. Please see PT note for orthostatic BPs.     Vision     Perception     Praxis      Pertinent Vitals/Pain Pain Assessment: 0-10 Pain Score: 8  Pain Location: face, back, R arm and leg Pain Descriptors / Indicators: Aching;Grimacing;Sore Pain Intervention(s): Limited activity within patient's tolerance;Monitored during session;Patient requesting pain  meds-RN notified;RN gave pain meds during session     Hand Dominance Right   Extremity/Trunk Assessment Upper Extremity Assessment Upper Extremity Assessment: RUE deficits/detail;LUE deficits/detail RUE Deficits / Details: Very poor effort during ROM/strength  testing but demonstrated anti-gravity movement at all joints. However, during functional tasks (holding phone, brushing teeth, donning clothing) noted patient had no difficulty with strength, coordination, ROM during those tasks. LUE Deficits / Details: AROM/strength [facility]FL   Lower Extremity Assessment Lower Extremity Assessment: Defer to PT evaluation   Cervical / Trunk Assessment Cervical / Trunk Assessment: Normal   Communication Communication Communication: No difficulties   Cognition Arousal/Alertness: Awake/alert Behavior During Therapy: Anxious;Impulsive Overall Cognitive Status: Impaired/Different from baseline Area of Impairment: Safety/judgement;Problem solving         Safety/Judgement: Decreased awareness of safety   Problem Solving: Slow processing;Requires verbal cues;Requires tactile cues;Difficulty sequencing     General Comments       Exercises       Shoulder Instructions      Home Living Family/patient expects to be discharged to:: Private residence Living Arrangements: Children Available Help at Discharge: Family;Available PRN/intermittently Type of Home: House Home Access: Stairs to enter Enter[employer] CorporationEntrance Stairs-Number of Steps: 5 Entrance Stairs-Rails: Right;Left;Can reach both Home Layout: One level     Bathroom Shower/Tub: Chief Strategy OfficerTub/shower unit   Bathroom Toilet: Standard     Home Equipment: None          Prior Functioning/Environment Level of Independence: Independent        Comments: Cabin crewshop manager    OT Diagnosis: Generalized weakness;Cognitive deficits;Acute pain   OT Problem List: Decreased strength;Decreased range of motion;Decreased activity tolerance;Impaired balance (sitting and/or standing);Decreased cognition;Decreased safety awareness;Decreased knowledge of use of DME or AE;Decreased knowledge of precautions;Pain   OT Treatment/Interventions: Self-care/ADL training;Therapeutic exercise;DME and/or AE instruction;Therapeutic  activities;Patient/family education    OT Goals(Current goals can be found in the care plan section) Acute Rehab OT Goals Patient Stated Goal: none stated OT Goal Formulation: With patient Time For Goal Achievement: 10/27/15 Potential to Achieve Goals: Good  OT Frequency: Min 2X/week   Barriers to D/C: Decreased caregiver support  14yo son is only caregiver available       Co-evaluation PT/OT/SLP Co-Evaluation/Treatment: Yes Reason for Co-Treatment: For patient/therapist safety;Necessary to address cognition/behavior during functional activity PT goals addressed during session: Mobility/safety with mobility OT goals addressed during session: ADL's and self-care      End of Session Equipment Utilized During Treatment: Gait belt;Rolling walker Nurse Communication: Mobility status  Activity Tolerance: Patient tolerated treatment well Patient left: in bed;with call bell/phone within reach;with bed alarm set;with family/visitor present   Time: [id number] OT Time Calculation (min): 39 min Charges:  OT General Charges $OT Visit: 1 Procedure OT Evaluation $OT Eval Moderate Complexity: 1 Procedure G-Codes: OT G-codes **NOT FOR INPATIENT CLASS** Functional Assessment Tool Used: clinical judgement Functional Limitation: Self care Self Care Current Status (V4098(G8987): At least 40 percent but less than 60 percent impaired, limited or restricted Self Care Goal Status (J1914(G8988): At least 1 percent but less than 20 percent impaired, limited or restricted  Chase [name] A 10/13/2015, 1:59 PM

## 2015-10-13 NOTE — Progress Notes (Signed)
Subjective: Feels his right sided weakness is improving slowly. No complaints.   Exam: Filed Vitals:   10/12/15 2300 10/13/15 0527  BP: 141/91 121/73  Pulse: 54 63  Temp: 97.7 F (36.5 C) 97.7 F (36.5 C)  Resp:  18    HEENT-  Normocephalic, scraps on the right aspect of head, without obvious abnormality.  Normal external eye and conjunctiva.  Normal TM's bilaterally.  Normal auditory canals and external ears. Normal external nose, mucus membranes and septum.  Normal pharynx. Cardiovascular- S1, S2 normal, pulses palpable throughout   Lungs- chest clear, no wheezing, rales, normal symmetric air entry Abdomen- normal findings: bowel sounds normal Extremities- no edema Lymph-no adenopathy palpable Musculoskeletal-pain in hip and tenderness in right knee joint Skin-warm and dry, no hyperpigmentation, vitiligo, or suspicious lesions    Gen: In bed, NAD MS: alert and oriented. Speech clear CN: 2-12 intact Motor: strength preserved on left side 5/5 with poor effort on the right but still 4/5 when distracted.  Sensory: intact throughout   Pertinent Labs/Diagnostics: EEG pending    Impression: 45 year old man who experienced an episode of loss of consciousness with associated trauma largely to his head and face, with complaints of weakness and numbness involving his right side. He has return of voluntary movement of his extremities. Effort with manual muscle testing remains limited. Suspect Psychophysiologic factors may be contributing to his symptomatology. Agree with PT.    Recommendations: 1) EEG pending. If negative no further work up per neurology and Neurology will S/O    10/13/2015, 8:42 AM  Felicie Mornavid Smith PA-C [facility] [phone]

## 2015-10-13 NOTE — Progress Notes (Signed)
Physical Therapy Treatment Patient Details Name: Chase [name] MRN: [id number] DOB: 08/15/1970 Today's Date: 10/13/2015    History of Present Illness Chase [name] is a 45 y.o. male with medical history significant for polysubstance abuse who presents to the ED with dizziness, nausea, and right sided weakness after falling the day prior out of a truck striking his head on pavement, and losing consciousness for 2-3 minutes.    PT Comments    Son present and reports that they worked on his leg last night and R leg was bending and ankle moving like the L LE.  Today, R ankle df of 2+/5 when tested and able to abd in supine which he was unable to do yesterday.    During ambulation, he walked to bathroom using RW with L foot first dragging R leg behind and returned to room with R LE first then L LE and near end had episode of almost falling where L hand came off RW and lurched forward to the L needing MAX A.  MOD A with standing from bed.  Stood from toilet and pulled up pants without A although had been instructed to wait for A.  Pt's primary caregiver at home is 45 year old son. At current level, do not feel this is a safe option for pt or the son.  Introduced idea of rehab and pt does not want, but discussed that he will need to be steadier on his feet and able to manage stairs to enter home with improved balance.  Will con't to assess.    Orthostatic Vitals Supine 160/94 HR 56 Sitting 149/88 HR 58 Standing 127/102 HR 63  Sitting after gait 149/93 HR 70   Follow Up Recommendations  SNF;Supervision for mobility/OOB     Equipment Recommendations  Rolling walker with 5" wheels    Recommendations for Other Services       Precautions / Restrictions Precautions Precautions: Fall Precaution Comments: monitor BP Restrictions Weight Bearing Restrictions: No    Mobility  Bed Mobility Overal bed mobility: Needs Assistance Bed Mobility: Supine to Sit;Sit to Supine     Supine to  sit: Min guard Sit to supine: Min assist   General bed mobility comments: heavy use of rail, assistance with RLE into bed  Transfers Overall transfer level: Needs assistance Equipment used: Rolling walker (2 wheeled) Transfers: Sit to/from Stand Sit to Stand: Mod assist;+2 physical assistance;+2 safety/equipment;From elevated surface         General transfer comment: MOD of 2 from elevated surface and cues for hand placement although pt not receptive to instruction. Pt later stood from toilet without A and pulled up underwear and shorts (Had been told to wait to have A)  Ambulation/Gait Ambulation/Gait assistance: Min assist;Max assist;+2 safety/equipment;+2 physical assistance Ambulation Distance (Feet): 10 Feet (x2) Assistive device: Rolling walker (2 wheeled) Gait Pattern/deviations: Decreased step length - right;Decreased dorsiflexion - right;Trunk flexed     General Gait Details: Pt ambulated to bathroom with L foot first and dragging R LE behind him.  On return walk, pt ambulated with R foot first then L foot with episode of falling forward to L with L hand coming off of RW and stepping to the L with MAX A to maintain upright posture.   Stairs            Wheelchair Mobility    Modified Rankin (Stroke Patients Only) Modified Rankin (Stroke Patients Only) Pre-Morbid Rankin Score: No symptoms Modified Rankin: Moderately severe disability  Balance Overall balance assessment: Needs assistance   Sitting balance-Leahy Scale: Fair     Standing balance support: During functional activity;Bilateral upper extremity supported Standing balance-Leahy Scale: Poor                      Cognition Arousal/Alertness: Awake/alert Behavior During Therapy: Anxious;Impulsive Overall Cognitive Status: Impaired/Different from baseline Area of Impairment: Safety/judgement;Problem solving         Safety/Judgement: Decreased awareness of safety   Problem Solving:  Slow processing;Requires verbal cues;Requires tactile cues;Difficulty sequencing      Exercises      General Comments General comments (skin integrity, edema, etc.): 18 year old son present and pt wanting him to help rather than therapists.      Pertinent Vitals/Pain Pain Assessment: 0-10 Pain Score: 8  Pain Location: face, back, R arm and R leg Pain Descriptors / Indicators: Aching;Grimacing;Sore Pain Intervention(s): Limited activity within patient's tolerance;Monitored during session;RN gave pain meds during session;Patient requesting pain meds-RN notified    Home Living Family/patient expects to be discharged to:: Private residence Living Arrangements: Children Available Help at Discharge: Family;Available PRN/intermittently Type of Home: House Home Access: Stairs to enter Entrance Stairs-Rails: Right;Left;Can reach both Home Layout: One level Home Equipment: None      Prior Function Level of Independence: Independent      Comments: Cabin crew   PT Goals (current goals can now be found in the care plan section) Acute Rehab PT Goals Patient Stated Goal: none stated PT Goal Formulation: With patient Time For Goal Achievement: 10/19/15 Potential to Achieve Goals: Good Progress towards PT goals: Progressing toward goals    Frequency  Min 3X/week    PT Plan Discharge plan needs to be updated    Co-evaluation PT/OT/SLP Co-Evaluation/Treatment: Yes Reason for Co-Treatment: For patient/therapist safety PT goals addressed during session: Mobility/safety with mobility OT goals addressed during session: ADL's and self-care     End of Session Equipment Utilized During Treatment: Gait belt Activity Tolerance: Patient limited by pain Patient left: in bed;with call bell/phone within reach;with bed alarm set;with family/visitor present     Time: 1610-9604 PT Time Calculation (min) (ACUTE ONLY): 39 min  Charges:  $Gait Training: 8-22 mins $Therapeutic Activity:  8-22 mins                    G Codes:      Marcelia Petersen LUBECK 10/13/2015, 2:35 PM

## 2015-10-13 NOTE — Hospital Discharge Follow-Up (Signed)
Message received from Lanier ClamKathy Mahabir, RN CM [facility]. There are not any appointments available at this time, the patient can call the office to check on cancellations after he is discharged.   Update provided to K. Mahabir, RN CM

## 2015-10-13 NOTE — Progress Notes (Signed)
OT Cancellation Note  Patient Details Name: Chase [name] MRN: [id number] DOB: 09/16/1970   Cancelled Treatment:    Reason Eval/Treat Not Completed: Patient declined, stating he didn't sleep and was nauseous and that he just wanted to go home. PT and OT were in middle of explaining purpose of therapy in assessing mobility to assist with dc home when pt took a cell phone call and spoke for several minutes and then told therapists that it would have to wait and he needed to take care of business. Nursing informed of refusal and nausea.  Calvert Charland A 10/13/2015, 10:12 AM
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
## 2015-10-13 NOTE — Care Management Note (Signed)
Case Management Note  Patient Details  Name: Chase [name] MRN: [id number] Date of Birth: 02/06/1971  Subjective/Objective: [facility] for HHC, dme-[facility] await HHC RN/social worker order, [id number], rolling walker-rep Clydie BraunKaren aware. [facility]                   Action/Plan:d/c plan home w/HHC.   Expected Discharge Date:                  Expected Discharge Plan:  Home w Home Health Services  In-House Referral:     Discharge planning Services  [facility]  Post Acute Care Choice:    Choice offered to:  Patient  DME Arranged:    DME Agency:     HH Arranged:    HH Agency:  [facility]  Status of Service:  In process, will continue to follow  If discussed at Long Length of Stay Meetings, dates discussed:    Additional Comments:  Lanier ClamMahabir, Cederic Mozley, RN 10/13/2015, 3:55 PM

## 2015-10-14 ENCOUNTER — Observation Stay (HOSPITAL_COMMUNITY)
Admit: 2015-10-14 | Discharge: 2015-10-14 | Disposition: A | Discharge status: Home / Self Care | Payer: Worker's Compensation | Attending: Family Medicine | Admitting: Family Medicine

## 2015-10-14 DIAGNOSIS — R29818 Other symptoms and signs involving the nervous system: Secondary | ICD-10-CM

## 2015-10-14 LAB — GLUCOSE, CAPILLARY: Glucose-Capillary: 80 mg/dL (ref 65–99)

## 2015-10-14 MED ORDER — HYDROCODONE-ACETAMINOPHEN 5-325 MG PO TABS: 1.0000 | ORAL_TABLET | ORAL | Status: DC | PRN

## 2015-10-14 MED ORDER — POLYETHYLENE GLYCOL 3350 17 G PO PACK: 17.0000 g | PACK | Freq: Every day | ORAL | Status: AC | PRN

## 2015-10-14 NOTE — Progress Notes (Signed)
Offsite EEG completed at [facility]. Results pending. 

## 2015-10-14 NOTE — Care Management Note (Signed)
Case Management Note  Patient Details  Name: Chase SimplerChadwick W Payes MRN: [id number] Date of Birth: 11/09/1970  Subjective/Objective: [facility] rep Clydie BraunKaren aware to follow for [facility]. [facility] DME rep-Jermaine aware of HH DME orders-3n1, 4 wheeled rw(rollator) w/c-patient will choose between rollator or w/c-patient cannot have both-patient voiced understanding.[facility] dme rep will deliver to rm prior d/c. No further d/c needs.                   Action/Plan:d/c home w/HHC/dme   Expected Discharge Date:                  Expected Discharge Plan:  Home w Home Health Services  In-House Referral:     Discharge planning Services  [facility]  Post Acute Care Choice:    Choice offered to:  Patient  DME Arranged:  3-N-1, Walker rolling with seat, Wheelchair manual DME Agency:  [facility]  HH Arranged:  RN, PT, OT, Nurse's Aide HH Agency:  [facility]  Status of Service:  Completed, signed off  If discussed at Long Length of Stay Meetings, dates discussed:    Additional Comments:  Lanier ClamMahabir, Dariah Mcsorley, RN 10/14/2015, 3:04 PM

## 2015-10-14 NOTE — Procedures (Signed)
History: 45 yo M with likely concussion  Sedation: none  Technique: This is a 21 channel routine scalp EEG performed at the bedside with bipolar and monopolar montages arranged in accordance to the international 10/20 system of electrode placement. One channel was dedicated to EKG recording.    Background: The background consists of intermixed alpha and beta activities. There is a well defined posterior dominant rhythm of 9 Hz that attenuates with eye opening. Sleep is recorded with normal appearing structures.   Photic stimulation: Physiologic driving is not performed  EEG Abnormalities: None  Clinical Interpretation: This normal EEG is recorded in the waking and sleep state. There was no seizure or seizure predisposition recorded on this study. Please note that a normal EEG does not preclude the possibility of epilepsy.   Ritta SlotMcNeill Saidee Geremia, MD [facility] [phone]  If 7pm- 7am, please page neurology on call as listed in AMION.

## 2015-10-14 NOTE — Progress Notes (Signed)
CSW received consult for SNF placement. CSW spoke with patient, friend - Lupita LeashDonna & 45 year old son at bedside re: discharge plans. Patient states that he is "absolutely not" going to a SNF, that he plans to return home and has great support at home. RNCM, Olegario MessierKathy made aware.    No further CSW needs identified - CSW signing off.   Lincoln MaxinKelly Kadyn Guild, LCSW [facility] Clinical Social Worker cell #: [phone]
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
## 2015-10-14 NOTE — Care Management Note (Signed)
Case Management Note  Patient Details  Name: Viviana SimplerChadwick W Madrazo MRN: [id number] Date of Birth: 04/29/1970  Subjective/Objective: [facility] No further d/c needs.                   Action/Plan:d/c home w/[facility]HC-[facility].   Expected Discharge Date:                  Expected Discharge Plan:  Home w Home Health Services  In-House Referral:     Discharge planning Services  [facility]  Post Acute Care Choice:    Choice offered to:  Patient  DME Arranged:  3-N-1, Walker rolling with seat, Wheelchair manual DME Agency:  [facility]  HH Arranged:  RN, PT, OT, Nurse's Aide HH Agency:  [facility]  Status of Service:  Completed, signed off  If discussed at Long Length of Stay Meetings, dates discussed:    Additional Comments:  Lanier ClamMahabir, Zelma Snead, RN 10/14/2015, 3:48 PM
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
## 2015-10-14 NOTE — Care Management Note (Signed)
Case Management Note  Patient Details  Name: Chase [name] MRN: [id number] Date of Birth: 06/04/1970  Subjective/Objective:  Patient declines SNF, wants home w/HHC. Has good family support. Medicaid potential. [facility] will not accept case-too high risk, Tried several other HHC agencies-all decline.[facility] DME recc-4w rw,w/c,3n1-[facility] dme rep aware.                  Action/Plan:d/c home w/HHC   Expected Discharge Date:                  Expected Discharge Plan:  Home w Home Health Services  In-House Referral:     Discharge planning Services  [facility]  Post Acute Care Choice:    Choice offered to:  Patient  DME Arranged:    DME Agency:     HH Arranged:    HH Agency:  [facility]  Status of Service:  In process, will continue to follow  If discussed at Long Length of Stay Meetings, dates discussed:    Additional Comments:  Lanier ClamMahabir, Laurens Matheny, RN 10/14/2015, 1:55 PM

## 2015-11-20 ENCOUNTER — Ambulatory Visit: Payer: Self-pay | Admitting: Family Medicine

## 2017-01-11 ENCOUNTER — Emergency Department (HOSPITAL_COMMUNITY)
Admission: EM | Admit: 2017-01-11 | Discharge: 2017-01-11 | Discharge status: Against Medical Advice | Payer: Worker's Compensation

## 2017-01-11 NOTE — ED Notes (Signed)
Unable to locate pt. At waiting area.

## 2018-01-14 ENCOUNTER — Observation Stay (HOSPITAL_COMMUNITY)
Admission: EM | Admit: 2018-01-14 | Discharge: 2018-01-17 | Disposition: A | Discharge status: Home / Self Care | Payer: Self-pay | Attending: Internal Medicine | Admitting: Internal Medicine

## 2018-01-14 ENCOUNTER — Other Ambulatory Visit: Payer: Self-pay

## 2018-01-14 ENCOUNTER — Emergency Department (HOSPITAL_COMMUNITY): Payer: Self-pay

## 2018-01-14 ENCOUNTER — Encounter (HOSPITAL_COMMUNITY): Payer: Self-pay

## 2018-01-14 DIAGNOSIS — Z885 Allergy status to narcotic agent status: Secondary | ICD-10-CM | POA: Insufficient documentation

## 2018-01-14 DIAGNOSIS — J439 Emphysema, unspecified: Secondary | ICD-10-CM | POA: Insufficient documentation

## 2018-01-14 DIAGNOSIS — I2510 Atherosclerotic heart disease of native coronary artery without angina pectoris: Secondary | ICD-10-CM | POA: Insufficient documentation

## 2018-01-14 DIAGNOSIS — I708 Atherosclerosis of other arteries: Secondary | ICD-10-CM | POA: Insufficient documentation

## 2018-01-14 DIAGNOSIS — R200 Anesthesia of skin: Secondary | ICD-10-CM | POA: Insufficient documentation

## 2018-01-14 DIAGNOSIS — R55 Syncope and collapse: Secondary | ICD-10-CM | POA: Diagnosis present

## 2018-01-14 DIAGNOSIS — Z720 Tobacco use: Secondary | ICD-10-CM | POA: Diagnosis present

## 2018-01-14 DIAGNOSIS — Z7982 Long term (current) use of aspirin: Secondary | ICD-10-CM | POA: Insufficient documentation

## 2018-01-14 DIAGNOSIS — F1910 Other psychoactive substance abuse, uncomplicated: Secondary | ICD-10-CM | POA: Diagnosis present

## 2018-01-14 DIAGNOSIS — E785 Hyperlipidemia, unspecified: Secondary | ICD-10-CM | POA: Insufficient documentation

## 2018-01-14 DIAGNOSIS — R0789 Other chest pain: Principal | ICD-10-CM | POA: Insufficient documentation

## 2018-01-14 DIAGNOSIS — Z8249 Family history of ischemic heart disease and other diseases of the circulatory system: Secondary | ICD-10-CM | POA: Insufficient documentation

## 2018-01-14 DIAGNOSIS — F17210 Nicotine dependence, cigarettes, uncomplicated: Secondary | ICD-10-CM | POA: Insufficient documentation

## 2018-01-14 DIAGNOSIS — Z79899 Other long term (current) drug therapy: Secondary | ICD-10-CM | POA: Insufficient documentation

## 2018-01-14 DIAGNOSIS — R079 Chest pain, unspecified: Secondary | ICD-10-CM | POA: Diagnosis present

## 2018-01-14 DIAGNOSIS — Z9119 Patient's noncompliance with other medical treatment and regimen: Secondary | ICD-10-CM | POA: Insufficient documentation

## 2018-01-14 HISTORY — DX: Chest pain, unspecified: R07.9

## 2018-01-14 HISTORY — DX: Other psychoactive substance abuse, uncomplicated: F19.10

## 2018-01-14 HISTORY — DX: Acute myocardial infarction, unspecified: I21.9

## 2018-01-14 HISTORY — DX: Syncope and collapse: R55

## 2018-01-14 LAB — RAPID URINE DRUG SCREEN, HOSP PERFORMED
AMPHETAMINES: NOT DETECTED
Barbiturates: NOT DETECTED
Benzodiazepines: POSITIVE — AB
COCAINE: POSITIVE — AB
OPIATES: NOT DETECTED
TETRAHYDROCANNABINOL: POSITIVE — AB

## 2018-01-14 LAB — BASIC METABOLIC PANEL
ANION GAP: 12 (ref 5–15)
BUN: 15 mg/dL (ref 6–20)
CO2: 23 mmol/L (ref 22–32)
Calcium: 9.4 mg/dL (ref 8.9–10.3)
Chloride: 104 mmol/L (ref 98–111)
Creatinine, Ser: 1.21 mg/dL (ref 0.61–1.24)
GFR calc non Af Amer: >60 mL/min (ref >60)
Glucose, Bld: 151 mg/dL — ABNORMAL HIGH (ref 70–99)
POTASSIUM: 3.7 mmol/L (ref 3.5–5.1)
SODIUM: 139 mmol/L (ref 135–145)

## 2018-01-14 LAB — I-STAT TROPONIN, ED
TROPONIN I, POC: 0 ng/mL (ref 0.00–0.08)
Troponin i, poc: 0 ng/mL (ref 0.00–0.08)

## 2018-01-14 LAB — CBC
HCT: 47.1 % (ref 39.0–52.0)
HEMOGLOBIN: 16 g/dL (ref 13.0–17.0)
MCH: 32.6 pg (ref 26.0–34.0)
MCHC: 34 g/dL (ref 30.0–36.0)
MCV: 95.9 fL (ref 78.0–100.0)
Platelets: 312 10*3/uL (ref 150–400)
RBC: 4.91 MIL/uL (ref 4.22–5.81)
RDW: 12.7 % (ref 11.5–15.5)
WBC: 10.3 10*3/uL (ref 4.0–10.5)

## 2018-01-14 MED ORDER — GI COCKTAIL ~~LOC~~: 30.0000 mL | Freq: Four times a day (QID) | ORAL | Status: DC | PRN

## 2018-01-14 MED ORDER — ACETAMINOPHEN 500 MG PO TABS
1000.0000 mg | ORAL_TABLET | Freq: Three times a day (TID) | ORAL | Status: DC
  Administered 2018-01-14 – 2018-01-17 (×8): 1000 mg via ORAL
  Filled 2018-01-14 (×8): qty 2

## 2018-01-14 MED ORDER — NITROGLYCERIN 0.4 MG SL SUBL
0.4000 mg | SUBLINGUAL_TABLET | Freq: Once | SUBLINGUAL | Status: AC
  Administered 2018-01-14: 0.4 mg via SUBLINGUAL
  Filled 2018-01-14: qty 1

## 2018-01-14 MED ORDER — ASPIRIN EC 81 MG PO TBEC
81.0000 mg | DELAYED_RELEASE_TABLET | Freq: Every day | ORAL | Status: DC
  Administered 2018-01-15 – 2018-01-17 (×3): 81 mg via ORAL
  Filled 2018-01-14 (×3): qty 1

## 2018-01-14 MED ORDER — FENTANYL CITRATE (PF) 100 MCG/2ML IJ SOLN
25.0000 ug | Freq: Once | INTRAMUSCULAR | Status: AC
  Administered 2018-01-14: 25 ug via INTRAVENOUS
  Filled 2018-01-14: qty 2

## 2018-01-14 MED ORDER — ENOXAPARIN SODIUM 40 MG/0.4ML ~~LOC~~ SOLN
40.0000 mg | Freq: Every day | SUBCUTANEOUS | Status: DC
  Filled 2018-01-14 (×3): qty 0.4

## 2018-01-14 MED ORDER — NITROGLYCERIN 0.4 MG SL SUBL: 0.4000 mg | SUBLINGUAL_TABLET | SUBLINGUAL | Status: DC | PRN

## 2018-01-14 MED ORDER — ACETAMINOPHEN 325 MG PO TABS: 650.0000 mg | ORAL_TABLET | ORAL | Status: DC | PRN

## 2018-01-14 MED ORDER — SODIUM CHLORIDE 0.9 % IV BOLUS
500.0000 mL | Freq: Once | INTRAVENOUS | Status: AC
  Administered 2018-01-14: 500 mL via INTRAVENOUS

## 2018-01-14 MED ORDER — IOPAMIDOL (ISOVUE-370) INJECTION 76%
INTRAVENOUS | Status: AC
  Administered 2018-01-14: 100 mL
  Filled 2018-01-14: qty 100

## 2018-01-14 MED ORDER — NICOTINE 21 MG/24HR TD PT24
21.0000 mg | MEDICATED_PATCH | Freq: Every day | TRANSDERMAL | Status: DC
  Administered 2018-01-15 (×2): 21 mg via TRANSDERMAL
  Filled 2018-01-14 (×2): qty 1

## 2018-01-14 MED ORDER — ONDANSETRON HCL 4 MG/2ML IJ SOLN: 4.0000 mg | Freq: Four times a day (QID) | INTRAMUSCULAR | Status: DC | PRN
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
## 2018-01-14 NOTE — ED Triage Notes (Signed)
Pt arrived via [facility], pt was pumping gas had central chest pain radiating down left arm drove himself home and had a syncopal episode witnessed by his son who called EMS.  Fire arrived first found pt with agonal breaths and started with BVM, pupils dilated and pt was diaphoretic on arrival.  EMS gave 324 ASA and 1 SL Nitro.

## 2018-01-14 NOTE — ED Provider Notes (Signed)
[facility] EMERGENCY DEPARTMENT Provider Note   CSN: [id number] Arrival date & time: 01/14/18  1440     History   Chief Complaint Chief Complaint  Patient presents with  . Chest Pain  . Fall    HPI Chase [name] is a 47 y.o. male.  HPI   Patient is a 47 year old male with a history of MI (at age 68?) who presents to the ED today c/o left sided chest pain that has been ongoing intermittently for 3 days but worsened and became constant today.  Patient states that he was walking into his house carrying some bags when he experienced continued chest pain and a syncopal episode.  When he awoke he had severe left-sided chest pain that radiated down his left arm and up into his jaw.  Reports tingling to the left upper extremity.  Pain felt similar to when he had his last heart attack.  Also with some shortness of breath that has improved.  He reports that he has had some dizziness/lightheadedness upon standing for the last several days as well.  Also reports an episode of hemoptysis.  In route to the ED was complaining of pain to the bilateral lower extremities, now worse in the left lower extremity.  Feels like a cramping sensation.  Denies swelling to the bilateral lower extremity's.  No recent periods of immobility, history of VTE, or history of cancer.  Patient received sublingual nitro and 325 aspirin via EMS prior to arrival which he states improved his pain from a 9/10 to 5/10.  He states that he also took several BC powders today.  He reports a history of tobacco and marijuana use.  Denies history of other illicit drug use. Denies early family hx of heart disease. States he has no diagnosis of hypertension or hyperlipidemia but he does not follow-up with his PCP regularly.  Past Medical History:  Diagnosis Date  . MI (myocardial infarction) [facility])     Patient Active Problem List   Diagnosis Date Noted  . Chest pain 01/14/2018  . Facial abrasion   . Focal  neurological deficit present 10/12/2015  . Right sided weakness 10/12/2015  . Tobacco abuse 10/12/2015  . Polysubstance abuse (HCC) 10/12/2015  . Right leg weakness 10/12/2015  . Concussion with loss of consciousness 10/12/2015  . Nasal bone fracture     History reviewed. No pertinent surgical history.      Home Medications    Prior to Admission medications   Medication Sig Start Date End Date Taking? Authorizing Provider  HYDROcodone-acetaminophen (NORCO/VICODIN) 5-325 MG tablet Take 1 tablet by mouth every 4 (four) hours as needed for moderate pain or severe pain (one for moderate pain, two for severe pain). 10/14/15   Regalado, Belkys A, MD  ibuprofen (ADVIL,MOTRIN) 200 MG tablet Take 600 mg by mouth every 6 (six) hours as needed for headache or moderate pain.    [provider]  polyethylene glycol (MIRALAX / GLYCOLAX) packet Take 17 g by mouth daily as needed for mild constipation. 10/14/15   Regalado, Prentiss Bells, MD    Family History History reviewed. No pertinent family history.  Social History Social History   Tobacco Use  . Smoking status: Current Every Day Smoker    Packs/day: 1.00    Types: Cigarettes  . Smokeless tobacco: Never Used  Substance Use Topics  . Alcohol use: Yes  . Drug use: Not on file     Allergies   Morphine and related   Review  of Systems Review of Systems  Constitutional: Negative for chills and fever.  HENT: Negative for congestion.   Eyes: Negative for visual disturbance.  Respiratory: Positive for shortness of breath. Negative for cough.   Cardiovascular: Positive for chest pain. Negative for leg swelling.  Gastrointestinal: Positive for abdominal pain. Negative for nausea and vomiting.  Genitourinary: Negative for dysuria and frequency.  Musculoskeletal: Negative for back pain and neck pain.       BLE pain  Skin: Negative for color change.  Neurological: Positive for dizziness, syncope and light-headedness. Negative for  seizures, weakness, numbness and headaches.     Physical Exam Updated Vital Signs BP (!) 108/96   Pulse 77   Temp 97.7 F (36.5 C) (Oral)   Resp 16   SpO2 98%   Physical Exam  Constitutional: He appears well-developed and well-nourished.  Non-toxic appearance. He does not appear ill. No distress.  HENT:  Head: Normocephalic and atraumatic.  Eyes: Pupils are equal, round, and reactive to light. Conjunctivae and EOM are normal.  Neck: Neck supple. No tracheal deviation present.  Cardiovascular: Normal rate. An irregularly irregular rhythm present.  No murmur heard. Pulmonary/Chest: Effort normal and breath sounds normal. No respiratory distress. He has no decreased breath sounds. He has no wheezes. He has no rales.  Abdominal: Soft. Bowel sounds are normal.  LUQ and LLQ TTP and guarding. No obvious pulsatile abdominal mass.  Musculoskeletal: He exhibits no edema.       Right lower leg: Normal. He exhibits no tenderness and no edema.       Left lower leg: Normal. He exhibits tenderness. He exhibits no edema.  Neurological: He is alert.  Skin: Skin is warm and dry. Capillary refill takes less than 2 seconds.  Psychiatric: He has a normal mood and affect.  Nursing note and vitals reviewed.   ED Treatments / Results  Labs (all labs ordered are listed, but only abnormal results are displayed) Labs Reviewed  BASIC METABOLIC PANEL - Abnormal; Notable for the following components:      Result Value   Glucose, Bld 151 (*)    All other components within normal limits  RAPID URINE DRUG SCREEN, HOSP PERFORMED - Abnormal; Notable for the following components:   Cocaine POSITIVE (*)    Benzodiazepines POSITIVE (*)    Tetrahydrocannabinol POSITIVE (*)    All other components within normal limits  CBC  CBG MONITORING, ED  I-STAT TROPONIN, ED  I-STAT TROPONIN, ED    EKG EKG Interpretation  Date/Time:  Sunday January 14 2018 14:45:28 EDT Ventricular Rate:  88 PR Interval:      QRS Duration: 102 QT Interval:  377 QTC Calculation: 464 R Axis:   106 Text Interpretation:  Sinus rhythm Right axis deviation Low voltage, precordial leads Confirmed by Kennis CarinaBero, Michael [phone]([id number]) on 01/14/2018 5:50:17 PM   Radiology Dg Chest Portable 1 View  Result Date: 01/14/2018 CLINICAL DATA:  Near syncope today. EXAM: PORTABLE CHEST 1 VIEW COMPARISON:  12/06/2010 and prior chest radiographs FINDINGS: Cardiomediastinal silhouette is unchanged with mild hilar prominence. There is no evidence of focal airspace disease, pulmonary edema, suspicious pulmonary nodule/mass, pleural effusion, or pneumothorax. No acute bony abnormalities are identified. IMPRESSION: No active disease. Electronically Signed   By: Harmon PierJeffrey  Hu M.D.   On: 01/14/2018 15:33   Ct Angio Chest/abd/pel For Dissection W And/or Wo Contrast  Result Date: 01/14/2018 CLINICAL DATA:  Chest pain radiating into the abdomen and low back since earlier today. Syncopal episode. EXAM: CT ANGIOGRAPHY  CHEST, ABDOMEN AND PELVIS TECHNIQUE: Multidetector CT imaging through the chest, abdomen and pelvis was performed using the standard protocol during bolus administration of intravenous contrast. Multiplanar reconstructed images and MIPs were obtained and reviewed to evaluate the vascular anatomy. CONTRAST:  ISOVUE-370 IOPAMIDOL (ISOVUE-370) INJECTION 76% COMPARISON:  Radiographs today. Chest, abdomen and pelvis CT 12/26/2006. FINDINGS: CTA CHEST FINDINGS Cardiovascular: Pre contrast images demonstrate mild aortic and coronary artery atherosclerosis. There are no displaced intimal calcifications. Following contrast, the aorta enhances normally. There is no evidence of aneurysm or dissection. The left vertebral artery arises directly from the aortic arch. The pulmonary arteries are well opacified with contrast to the level of the subsegmental branches. There is no evidence of acute pulmonary embolism. The heart size is normal. There is no pericardial  effusion. Mediastinum/Nodes: There are no enlarged mediastinal, hilar or axillary lymph nodes. The thyroid gland, trachea and esophagus appear normal. Lungs/Pleura: No pleural effusion or pneumothorax. There is mild emphysema with mild linear atelectasis or scarring at both lung bases. There is a stable 5 mm perifissural nodule along the minor fissure on image 89, consistent with a lymph node. No suspicious pulmonary nodules. Musculoskeletal: No evidence of chest wall mass or acute osseous findings. Review of the MIP images confirms the above findings. CTA ABDOMEN AND PELVIS FINDINGS VASCULAR Aorta: Mild atherosclerosis with mild dilatation of the infrarenal aorta having a maximal diameter of 2.0 cm. No focal aneurysm or dissection. Celiac: Normal. SMA: Normal Renals: Normal IMA: Normal. Inflow: Mild atherosclerosis without stenosis or occlusion. Veins: Limited opacification.  No suggested abnormality. Review of the MIP images confirms the above findings. NON-VASCULAR Hepatobiliary: The liver appears normal without focal abnormality. No evidence of gallstones, gallbladder wall thickening or biliary dilatation. Pancreas: Unremarkable. No pancreatic mass, ductal dilatation or surrounding inflammation. Spleen: Normal in size without focal abnormality. Adrenals/Urinary Tract: Both adrenal glands appear normal. Both kidneys appear normal. No evidence of urinary tract calculus or hydronephrosis. The bladder appears normal. Stomach/Bowel: No bowel wall thickening, distention or surrounding inflammation. The appendix appears normal. Moderate stool throughout the colon. Lymphatic: No enlarged abdominopelvic lymph nodes. Reproductive: The prostate gland and seminal vesicles appear normal. Other: Intact anterior abdominal wall.  No ascites. Musculoskeletal: Chronic bilateral L5 pars defects without significant anterolisthesis or foraminal narrowing at L5-S1. Review of the MIP images confirms the above findings. IMPRESSION: 1.  No acute findings or explanation for the patient's symptoms. There is no evidence of aortic dissection, pulmonary embolism or large vessel occlusion. 2. Mild aortoiliac atherosclerosis. 3.  Emphysema (ICD10-J43.9). 4. 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 Electronically Signed   By: Carey Bullocks M.D.   On: 01/14/2018 17:47    Procedures Procedures (including critical care time)  Medications Ordered in ED Medications  iopamidol (ISOVUE-370) 76 % injection (100 mLs  Contrast Given 01/14/18 1645)  nitroGLYCERIN (NITROSTAT) SL tablet 0.4 mg (0.4 mg Sublingual Given 01/14/18 1856)  sodium chloride 0.9 % bolus 500 mL (0 mLs Intravenous Stopped 01/14/18 1928)  fentaNYL (SUBLIMAZE) injection 25 mcg (25 mcg Intravenous Given 01/14/18 2013)     Initial Impression / Assessment and Plan / ED Course  I have reviewed the triage vital signs and the nursing notes.  Pertinent labs & imaging results that were available during my care of the patient were reviewed by me and considered in my medical decision making (see chart for details).   rechecked pt. He is c/o ongoing chest pain. Will order additional ntg.  Rechecked  pt. Discussed results and plan. Received ntg 0.4 20 mins ago and is still have chest pain.   Final Clinical Impressions(s) / ED Diagnoses   Final diagnoses:  None   Patient presented the ED today complaining of intermittent chest pain for the last 3 days and syncopal episode today with severe left-sided chest pain radiating to the left arm and jaw.  Patient states symptoms are similar to when he had his last heart attack.  Vital signs have been stable while in the ED.  Patient has been in no acute distress.  Cardiac and pulmonary exams are grossly benign on initial exam.  CBC without anemia.  BMP with normal electrolytes.  Normal kidney function.  Initial troponin negative, delta troponin negative.  UDS pending, h/o cocaine use.  Question if this could be a contributing factor. Pt denies drug use today.  CXR with no PTX, PNA, or significantly widened mediastinum.  CT Chest/abd/pelvis negative for dissection, LVO, or pulmonary embolism.  Pt high risk for cardiac etiology given h/o MI and ongoing chest pain with radiation to the LUE and jaw. Will plan for admission for chest pain.   CONSULT with hospitalist service, Dr. Rema Jasmine who will admit the patient for observation.  ED Discharge Orders    None       Rayne Du 01/14/18 2133    Sabas Sous, MD 01/14/18 [phone]

## 2018-01-14 NOTE — ED Notes (Signed)
Pt remains in CT

## 2018-01-15 ENCOUNTER — Observation Stay (HOSPITAL_COMMUNITY): Payer: Self-pay

## 2018-01-15 ENCOUNTER — Observation Stay (HOSPITAL_BASED_OUTPATIENT_CLINIC_OR_DEPARTMENT_OTHER): Payer: Self-pay

## 2018-01-15 ENCOUNTER — Encounter (HOSPITAL_COMMUNITY): Payer: Self-pay | Admitting: Cardiology

## 2018-01-15 DIAGNOSIS — I200 Unstable angina: Secondary | ICD-10-CM

## 2018-01-15 DIAGNOSIS — R079 Chest pain, unspecified: Secondary | ICD-10-CM

## 2018-01-15 DIAGNOSIS — F1490 Cocaine use, unspecified, uncomplicated: Secondary | ICD-10-CM

## 2018-01-15 DIAGNOSIS — R55 Syncope and collapse: Secondary | ICD-10-CM | POA: Diagnosis present

## 2018-01-15 LAB — CBC
HCT: 45.6 % (ref 39.0–52.0)
Hemoglobin: 15.1 g/dL (ref 13.0–17.0)
MCH: 32 pg (ref 26.0–34.0)
MCHC: 33.1 g/dL (ref 30.0–36.0)
MCV: 96.6 fL (ref 78.0–100.0)
PLATELETS: 307 10*3/uL (ref 150–400)
RBC: 4.72 MIL/uL (ref 4.22–5.81)
RDW: 13 % (ref 11.5–15.5)
WBC: 6.6 10*3/uL (ref 4.0–10.5)

## 2018-01-15 LAB — BASIC METABOLIC PANEL
ANION GAP: 8 (ref 5–15)
BUN: 20 mg/dL (ref 6–20)
CO2: 23 mmol/L (ref 22–32)
Calcium: 8.7 mg/dL — ABNORMAL LOW (ref 8.9–10.3)
Chloride: 108 mmol/L (ref 98–111)
Creatinine, Ser: 1.23 mg/dL (ref 0.61–1.24)
GFR calc Af Amer: >60 mL/min (ref >60)
Glucose, Bld: 99 mg/dL (ref 70–99)
Potassium: 4 mmol/L (ref 3.5–5.1)
SODIUM: 139 mmol/L (ref 135–145)

## 2018-01-15 LAB — TSH: TSH: 1.362 u[IU]/mL (ref 0.350–4.500)

## 2018-01-15 LAB — LIPID PANEL
CHOLESTEROL: 175 mg/dL (ref 0–200)
HDL: 27 mg/dL — ABNORMAL LOW (ref >40)
LDL Cholesterol: 118 mg/dL — ABNORMAL HIGH (ref 0–99)
TRIGLYCERIDES: 151 mg/dL — AB (ref <150)
Total CHOL/HDL Ratio: 6.5 RATIO
VLDL: 30 mg/dL (ref 0–40)

## 2018-01-15 LAB — HEMOGLOBIN A1C
Hgb A1c MFr Bld: 5.6 % (ref 4.8–5.6)
Mean Plasma Glucose: 114.02 mg/dL

## 2018-01-15 LAB — ECHOCARDIOGRAM COMPLETE
HEIGHTINCHES: 70 in
WEIGHTICAEL: 7760.192 [oz_av]

## 2018-01-15 LAB — TROPONIN I: Troponin I: <0.03 ng/mL (ref <0.03)

## 2018-01-15 LAB — HIV ANTIBODY (ROUTINE TESTING W REFLEX): HIV Screen 4th Generation wRfx: NONREACTIVE

## 2018-01-15 MED ORDER — NITROGLYCERIN 0.4 MG SL SUBL
0.8000 mg | SUBLINGUAL_TABLET | Freq: Once | SUBLINGUAL | Status: AC
  Administered 2018-01-15: 0.8 mg via SUBLINGUAL

## 2018-01-15 MED ORDER — IOPAMIDOL (ISOVUE-370) INJECTION 76%
100.0000 mL | Freq: Once | INTRAVENOUS | Status: AC | PRN
  Administered 2018-01-15: 100 mL via INTRAVENOUS

## 2018-01-15 MED ORDER — NITROGLYCERIN 0.4 MG SL SUBL
SUBLINGUAL_TABLET | SUBLINGUAL | Status: AC
  Filled 2018-01-15: qty 2

## 2018-01-15 MED ORDER — NICOTINE 21 MG/24HR TD PT24
21.0000 mg | MEDICATED_PATCH | Freq: Every day | TRANSDERMAL | Status: DC
  Administered 2018-01-16: 21 mg via TRANSDERMAL
  Filled 2018-01-15: qty 1

## 2018-01-15 NOTE — Progress Notes (Signed)
  Echocardiogram 2D Echocardiogram has been performed.  Patient non compliant during exam. Unable to obtain accurate images due to breathing and inability to remain in position best suited for apical images.   Chase [name] L Androw 01/15/2018, 8:26 AM
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
## 2018-01-15 NOTE — Progress Notes (Signed)
Pt's cardiac CTA is worrisome for CAD that could be obstructive  - the results have been sent to eval FFR and then if non obstructive would not need cath.  Results will not be back until tomorrow.  Pt should stay until results are evaluated.

## 2018-01-15 NOTE — Progress Notes (Addendum)
PROGRESS NOTE    Chase [name]  WUJ:[id number] DOB: August 11, 1970 DOA: 01/14/2018 PCP: System, Pcp Not In   Brief Narrative:   History of present illness:  History is obtained via chart review as well as discussion with the patient and his ex-wife and children who are present at the bedside. 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 Subsequently he has had intermittent chest pain, worse over the past few weeks. He reported the chest pain was 10 out of 10 on 9/22 so he went to ED. He described as a pressure in the middle of his chest that radiated to his left arm via a feeling of numbness. Associated symptoms included emesis x2 nonbloody nonbilious. He reported never being diagnosed with myocardial infarction, has never undergone a cardiac catheterization. He reported  while walking in his home he had an episode of syncope. He did not recall any pre-ceding  symptoms other than the chest pain which has been going on for weeks slightly worsened on day of admission. He did not take anything for pain on a regular basis, sited he occasionally will use nonsteroidal anti-inflammatory drugs. He reported last using cocaine 1 month ago. He uses marijuana daily. He smokes 2 to 3 packs of cigarettes daily, he is interested in stopping. Denied drinking alcohol over the past month.  Assessment & Plan:   Principal Problem:   Unstable angina Active Problems:   Tobacco abuse   Polysubstance abuse (HCC)   Syncope  #Chest pain Course: multi-week hx.  Atypical. Somewhat reproducable. Troponin negative x2. CTA ruled out acute aorta pathology. Likely msk in setting of cocaine use and chronic chest pain since injury 12 months ago. CT angiogram ruled out acute aorta pathology. provided with asa and acetaminophen 1 g TID. HgA1c 5.6, lipid panel, triglyceride 151, HDL 27 LDL 118. Echo with EF 60-65% with normal wall motion. ekg  without acute changes. No events on tele. Evaluated by cardiology who recommended at Coronary CT scan.  It is abnormal and further cardiac management is planned.  There appears to be RCA and Left main disease  #Syncope Etiology uncertain. Patient reports this is 4th episode. Polysubstance use verified on UDS - likely contributor. Echo as noted above. Not orthostatic. No events on tele. No s/sx infection. No metabolic derangement. Evaluated by cardiology  #Other problems: -Polysubstance use: UDS positive for cocaine, benzo, NFA:OZHYQMV reported using TCH daily, not to recent cocaine or benzo use, recommend ongoing cessation efforts. Counseled by all providers -Current smoker:2-3   Procedures:  Echo as noted above  CTA chest as noted above  Consultations:  Dr Anne Fu cardiology   DVT prophylaxis: Lovenox Code Status: Full Family Communication: Pt's son present for NP exam this am Disposition Plan: observe in Hospital for management of Unstable angina   Subjective: Pt comfortable with no further chest pain  Objective: Vitals:   01/15/18 1101 01/15/18 1420 01/15/18 1616 01/15/18 1803  BP:  109/62 124/80 129/80  Pulse:  65 70 73  Resp:  16 18 16   Temp:  98 F (36.7 C) 98 F (36.7 C) (!) 97.5 F (36.4 C)  TempSrc:  Oral Oral Oral  SpO2:  100% 95% 97%  Weight: 79.4 kg     Height:       No intake or output data in the 24 hours ending 01/15/18 1830 Filed Weights   01/14/18 2201 01/15/18 1101  Weight: (!) 220  kg 79.4 kg    Examination:  General exam: Appears calm and comfortable  Respiratory system: Clear to auscultation. Respiratory effort normal. Cardiovascular system: S1 & S2 heard, RRR. No JVD, murmurs, rubs, gallops or clicks. No pedal edema. Gastrointestinal system: Abdomen is nondistended, soft and nontender. No organomegaly or masses felt. Normal bowel sounds heard. Central nervous system: Alert and oriented. No focal neurological deficits. Extremities:  Symmetric 5 x 5 power. Skin: No rashes, lesions or ulcers Psychiatry: Judgement and insight appear normal. Mood & affect appropriate.     Data Reviewed: I have personally reviewed following labs and imaging studies  CBC: Recent Labs  Lab 01/14/18 1450 01/15/18 0342  WBC 10.3 6.6  HGB 16.0 15.1  HCT 47.1 45.6  MCV 95.9 96.6  PLT 312 307   Basic Metabolic Panel: Recent Labs  Lab 01/14/18 1450 01/15/18 0342  NA 139 139  K 3.7 4.0  CL 104 108  CO2 23 23  GLUCOSE 151* 99  BUN 15 20  CREATININE 1.21 1.23  CALCIUM 9.4 8.7*   GFR: Estimated Creatinine Clearance: 77.5 mL/min (by C-G formula based on SCr of 1.23 mg/dL). Liver Function Tests: No results for input(s): AST, ALT, ALKPHOS, BILITOT, PROT, ALBUMIN in the last 168 hours. No results for input(s): LIPASE, AMYLASE in the last 168 hours. No results for input(s): AMMONIA in the last 168 hours. Coagulation Profile: No results for input(s): INR, PROTIME in the last 168 hours. Cardiac Enzymes: Recent Labs  Lab 01/14/18 2354 01/15/18 0828  TROPONINI <0.03 <0.03   BNP (last 3 results) No results for input(s): PROBNP in the last 8760 hours. HbA1C: Recent Labs    01/15/18 0342  HGBA1C 5.6   CBG: No results for input(s): GLUCAP in the last 168 hours. Lipid Profile: Recent Labs    01/15/18 0342  CHOL 175  HDL 27*  LDLCALC 118*  TRIG 151*  CHOLHDL 6.5   Thyroid Function Tests: Recent Labs    01/15/18 0342  TSH 1.362   Anemia Panel: No results for input(s): VITAMINB12, FOLATE, FERRITIN, TIBC, IRON, RETICCTPCT in the last 72 hours. Sepsis Labs: No results for input(s): PROCALCITON, LATICACIDVEN in the last 168 hours.  No results found for this or any previous visit (from the past 240 hour(s)).       Radiology Studies: Ct Coronary Morph W/cta Cor W/score W/ca W/cm &/or Wo/cm  Addendum Date: 01/15/2018   ADDENDUM REPORT: 01/15/2018 17:29 CLINICAL DATA:  Chest pain EXAM: Cardiac CTA MEDICATIONS: Sub  lingual nitro. 4mg  x 2 and lopressor 5mg  IV TECHNIQUE: The patient was scanned on a Siemens 192 slice scanner. Gantry rotation speed was 250 msecs. Collimation was 0.6 mm. A 100 kV prospective scan was triggered in the ascending thoracic aorta at 35-75% of the R-R interval. Average HR during the scan was 60 bpm. The 3D data set was interpreted on a dedicated work station using MPR, MIP and VRT modes. A total of 80cc of contrast was used. FINDINGS: Non-cardiac: See separate report from [facility]. Pulmonary veins drain normally to the left atrium. Calcium Score: 73 Agatston units. Coronary Arteries: Right dominant with no anomalies LM: No plaque or stenosis. LAD system: Long, moderate proximal LAD stenosis, mixed plaque. Circumflex system: 50% proximal LCx stenosis with mixed plaque. RCA system: Moderate proximal RCA stenosis, soft plaque. 50% mid RCA stenosis, soft plaque. Possible moderate proximal PLV branch stenosis, but involved by misregistration artifact so not clear. IMPRESSION: 1. Coronary artery calcium score 73 Agatston units. This places the  patient in the 92nd percentile for age and gender, suggesting high risk for future cardiac events. 2. Suspect moderate proximal RCA stenosis followed by a 50% mid RCA stenosis. Also with possible moderate disease in the proximal PLV branch. 3.  Long, moderate proximal LAD stenosis. 4.  50% proximal LCx stenosis. The proximal RCA and proximal LAD lesions are concerning for hemodynamically significant disease. This study was sent for CT FFR. Dalton Mclean Electronically Signed   By: Marca Anconaalton  Mclean M.D.   On: 01/15/2018 17:29   Result Date: 01/15/2018 EXAM: OVER-READ INTERPRETATION  CT CHEST The following report is an over-read performed by radiologist Dr. Genevive [facility]Stewart Edmunds [facility] on 01/15/2018. This over-read does not include interpretation of cardiac or coronary anatomy or pathology. The coronary CTA interpretation by the cardiologist is  attached. COMPARISON:  None. FINDINGS: Limited view of the lung parenchyma demonstrates 4 mm nodule along the horizontal fissure/RIGHT middle lobe (image 10/12). Airways are normal. Limited view of the mediastinum demonstrates no adenopathy. Esophagus normal. Limited view of the upper abdomen unremarkable. Limited view of the skeleton and chest wall is unremarkable. IMPRESSION: 4 mm RIGHT middle lobe nodule. No follow-up needed if patient is low-risk. Non-contrast chest CT can be considered in 12 months if patient is high-risk. This recommendation follows the consensus statement: Guidelines for Management of Incidental Pulmonary Nodules Detected on CT Images: From the Fleischner Society 2017; Radiology 2017; 284:228-243. Electronically Signed: By: Genevive BiStewart  Edmunds M.D. On: 01/15/2018 16:16   Dg Chest Portable 1 View  Result Date: 01/14/2018 CLINICAL DATA:  Near syncope today. EXAM: PORTABLE CHEST 1 VIEW COMPARISON:  12/06/2010 and prior chest radiographs FINDINGS: Cardiomediastinal silhouette is unchanged with mild hilar prominence. There is no evidence of focal airspace disease, pulmonary edema, suspicious pulmonary nodule/mass, pleural effusion, or pneumothorax. No acute bony abnormalities are identified. IMPRESSION: No active disease. Electronically Signed   By: Harmon PierJeffrey  Hu M.D.   On: 01/14/2018 15:33   Ct Angio Chest/abd/pel For Dissection W And/or Wo Contrast  Result Date: 01/14/2018 CLINICAL DATA:  Chest pain radiating into the abdomen and low back since earlier today. Syncopal episode. EXAM: CT ANGIOGRAPHY CHEST, ABDOMEN AND PELVIS TECHNIQUE: Multidetector CT imaging through the chest, abdomen and pelvis was performed using the standard protocol during bolus administration of intravenous contrast. Multiplanar reconstructed images and MIPs were obtained and reviewed to evaluate the vascular anatomy. CONTRAST:  100mL ISOVUE-370 IOPAMIDOL (ISOVUE-370) INJECTION 76% COMPARISON:  Radiographs today. Chest,  abdomen and pelvis CT 12/26/2006. FINDINGS: CTA CHEST FINDINGS Cardiovascular: Pre contrast images demonstrate mild aortic and coronary artery atherosclerosis. There are no displaced intimal calcifications. Following contrast, the aorta enhances normally. There is no evidence of aneurysm or dissection. The left vertebral artery arises directly from the aortic arch. The pulmonary arteries are well opacified with contrast to the level of the subsegmental branches. There is no evidence of acute pulmonary embolism. The heart size is normal. There is no pericardial effusion. Mediastinum/Nodes: There are no enlarged mediastinal, hilar or axillary lymph nodes. The thyroid gland, trachea and esophagus appear normal. Lungs/Pleura: No pleural effusion or pneumothorax. There is mild emphysema with mild linear atelectasis or scarring at both lung bases. There is a stable 5 mm perifissural nodule along the minor fissure on image 89, consistent with a lymph node. No suspicious pulmonary nodules. Musculoskeletal: No evidence of chest wall mass or acute osseous findings. Review of the MIP images confirms the above findings. CTA ABDOMEN AND PELVIS FINDINGS VASCULAR Aorta: Mild atherosclerosis with mild dilatation  of the infrarenal aorta having a maximal diameter of 2.0 cm. No focal aneurysm or dissection. Celiac: Normal. SMA: Normal Renals: Normal IMA: Normal. Inflow: Mild atherosclerosis without stenosis or occlusion. Veins: Limited opacification.  No suggested abnormality. Review of the MIP images confirms the above findings. NON-VASCULAR Hepatobiliary: The liver appears normal without focal abnormality. No evidence of gallstones, gallbladder wall thickening or biliary dilatation. Pancreas: Unremarkable. No pancreatic mass, ductal dilatation or surrounding inflammation. Spleen: Normal in size without focal abnormality. Adrenals/Urinary Tract: Both adrenal glands appear normal. Both kidneys appear normal. No evidence of urinary  tract calculus or hydronephrosis. The bladder appears normal. Stomach/Bowel: No bowel wall thickening, distention or surrounding inflammation. The appendix appears normal. Moderate stool throughout the colon. Lymphatic: No enlarged abdominopelvic lymph nodes. Reproductive: The prostate gland and seminal vesicles appear normal. Other: Intact anterior abdominal wall.  No ascites. Musculoskeletal: Chronic bilateral L5 pars defects without significant anterolisthesis or foraminal narrowing at L5-S1. Review of the MIP images confirms the above findings. IMPRESSION: 1. No acute findings or explanation for the patient's symptoms. There is no evidence of aortic dissection, pulmonary embolism or large vessel occlusion. 2. Mild aortoiliac atherosclerosis. 3.  Emphysema (ICD10-J43.9). 4. 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 Electronically Signed   By: Carey Bullocks M.D.   On: 01/14/2018 17:47        Scheduled Meds: . acetaminophen  1,000 mg Oral Q8H  . aspirin EC  81 mg Oral Daily  . enoxaparin (LOVENOX) injection  40 mg Subcutaneous QHS  . nicotine  21 mg Transdermal Daily  . nitroGLYCERIN       Continuous Infusions:   LOS: 0 days    Time spent: 55 min    Lahoma Crocker, MD, FACP [facility] Pager [phone]  If 7PM-7AM, please contact night-coverage [email or web address] Password [facility] 01/15/2018, 6:30 PM

## 2018-01-16 DIAGNOSIS — I2510 Atherosclerotic heart disease of native coronary artery without angina pectoris: Secondary | ICD-10-CM

## 2018-01-16 DIAGNOSIS — R0789 Other chest pain: Secondary | ICD-10-CM

## 2018-01-16 DIAGNOSIS — R079 Chest pain, unspecified: Secondary | ICD-10-CM

## 2018-01-16 DIAGNOSIS — R55 Syncope and collapse: Secondary | ICD-10-CM

## 2018-01-16 DIAGNOSIS — F1910 Other psychoactive substance abuse, uncomplicated: Secondary | ICD-10-CM

## 2018-01-16 MED ORDER — ATORVASTATIN CALCIUM 40 MG PO TABS: 40.0000 mg | ORAL_TABLET | Freq: Every day | ORAL | 0 refills | Status: DC

## 2018-01-16 MED ORDER — ASPIRIN 81 MG PO TBEC: 81.0000 mg | DELAYED_RELEASE_TABLET | Freq: Every day | ORAL | Status: AC

## 2018-01-16 MED ORDER — ATORVASTATIN CALCIUM 40 MG PO TABS
40.0000 mg | ORAL_TABLET | Freq: Every day | ORAL | Status: DC
  Administered 2018-01-16: 40 mg via ORAL
  Filled 2018-01-16: qty 1

## 2018-01-16 MED ORDER — NICOTINE 21 MG/24HR TD PT24: 21.0000 mg | MEDICATED_PATCH | Freq: Every day | TRANSDERMAL | 0 refills | Status: DC

## 2018-01-16 NOTE — Progress Notes (Signed)
  [place] TEAM 1 - Stepdown/ICU TEAM  Addendum  After initial plan to d/c pt home, pt appealed to MD to allow him to remain in the hospital 1 additional night.  He reports that if he goes home today he will be alone all night, and he is worried about suffering another syncopal spell when he is alone.   I feel this is a reasonable request, and in his best interest in regard to safety.  We will keep him on tele to assure no arhythmia is appreciated.    Assuming no new events are encountered over night, he should be ready for d/c 9/25.    I have counseled him on the absolute need to stop cocaine and tobacco abuse entirely.    Lonia BloodJeffrey T. Prince Couey, MD [facility] Office  [phone] Pager - Text Page per Amion as per below:  On-Call/Text Page:      [email or web address]      password [email or web address]  If 7PM-7AM, please contact night-coverage [email or web address] Password [email or web address] 01/16/2018, 3:58 PM

## 2018-01-16 NOTE — Progress Notes (Addendum)
   Coronary CTA FFR results as follows: IMPRESSION: 1.  No hemodynamically significant disease in the RCA.  2.  No hemodynamically significant disease in the LCx.  3. The proximal and mid LAD FFR does not suggest hemodynamically significant upstream stenosis. FFR 0.77 in the distal LAD => there did not appear to be significant mid-distal LAD disease, therefore, suspect this finding may be the cumulative result of several areas of mild to moderate disease upstream.  This is a low risk study overall.  Discussed results with patient and benefits of lifestyle modifications (smoking cessation and recreational drug cessation).   A/P: Patient with mild-moderate LAD disease on Coronary CTA - Recommend continuing aspirin - Will start atorvastatin 40mg  daily given dyslipidemia on labs this admission.  - Continue to encourage smoking cessation. Patient is interested in smoking cessation and asked about obtaining nicotine patches at discharge.  - Outpatient follow-up arranged with Dr. Anne FuSkains  [facility] will sign off.   Medication Recommendations:  Continue aspirin and atorvastatin Other recommendations (labs, testing, etc):  none Follow up as an outpatient:  Appt with Dr. Anne FuSkains 02/27/18 at 10:20am  Agree with above plan. Reassuring FFR. CAD present. Secondary prevention.   Will follow up.  Donato SchultzMark Leeasia Secrist, MD

## 2018-01-16 NOTE — Discharge Instructions (Signed)
Chest Wall Pain °Chest wall pain is pain in or around the bones and muscles of your chest. Sometimes, an injury causes this pain. Sometimes, the cause may not be known. This pain may take several weeks or longer to get better. °Follow these instructions at home: °Pay attention to any changes in your symptoms. Take these actions to help with your pain: °· Rest as told by your health care provider. °· Avoid activities that cause pain. These include any activities that use your chest muscles or your abdominal and side muscles to lift heavy items. °· If directed, apply ice to the painful area: °? Put ice in a plastic bag. °? Place a towel between your skin and the bag. °? Leave the ice on for 20 minutes, 2-3 times per day. °· Take over-the-counter and prescription medicines only as told by your health care provider. °· Do not use tobacco products, including cigarettes, chewing tobacco, and e-cigarettes. If you need help quitting, ask your health care provider. °· Keep all follow-up visits as told by your health care provider. This is important. ° °Contact a health care provider if: °· You have a fever. °· Your chest pain becomes worse. °· You have new symptoms. °Get help right away if: °· You have nausea or vomiting. °· You feel sweaty or light-headed. °· You have a cough with phlegm (sputum) or you cough up blood. °· You develop shortness of breath. °This information is not intended to replace advice given to you by your health care provider. Make sure you discuss any questions you have with your health care provider. °Document Released: 04/11/2005 Document Revised: 08/20/2015 Document Reviewed: 07/07/2014 °Elsevier Interactive Patient Education © 2018 Elsevier Inc. ° °

## 2018-01-16 NOTE — Discharge Summary (Signed)
Physician Discharge Summary  Chase [name]:[id number] DOB: August 15, 1970 DOA: 01/14/2018  PCP: System, Pcp Not In  Admit date: 01/14/2018 Discharge date: 01/16/2018  Time spent: 35 minutes  Discharge Diagnoses:  Active Problems:   Tobacco abuse   Polysubstance abuse [facility])   Discharge Condition: stable  Diet recommendation: heart healthy  Filed Weights   01/14/18 2201 01/15/18 1101  Weight: (!) 220 kg 79.4 kg    History of present illness:  History is obtained via chart review as well as discussion with the patient and his ex-wife and children who are present at the bedside.  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  Subsequently he has had intermittent chest pain, worse over the past few weeks.  He reported the chest pain was 10 out of 10 on 9/22 so he went to ED. He described as a pressure in the middle of his chest that radiated to his left arm via a feeling of numbness.  Associated symptoms included emesis x2 nonbloody nonbilious.  He reported never being diagnosed with myocardial infarction, has never undergone a cardiac catheterization.  He reported  while walking in his home he had an episode of syncope.  He did not recall any pre-ceding  symptoms other than the chest pain which has been going on for weeks slightly worsened on day of admission. He did not take anything for pain on a regular basis, sited he occasionally will use nonsteroidal anti-inflammatory drugs.  He reported last using cocaine 1 month ago.  He uses marijuana daily.  He smokes 2 to 3 packs of cigarettes daily, he is interested in stopping.  Denied drinking alcohol over the past month.  Hospital Course:   Chest pain multi-week hx.  Atypical. Somewhat reproducable. Troponin negative. CTA ruled out acute aorta pathology. Likely msk in setting of cocaine use and chronic chest pain since injury 12 months ago. HgA1c 5.6, lipid panel,  triglyceride 151, HDL 27 LDL 118. Echo with EF 60-65% with normal wall motion. ekg without acute changes. No events on tele. Evaluated by cardiology and had a cardiac CT. Final recommendations per Cardiology were as follows: - Patient with mild-moderate LAD disease on Coronary CTA - Recommend continuing aspirin - Will start atorvastatin 40mg  daily given dyslipidemia on labs this admission.  - Continue to encourage smoking cessation.  - Outpatient follow-up arranged with Dr. Anne Fu  Syncope Etiology uncertain.  Patient reports this is 4th episode. Polysubstance use verified on UDS - likely contributor. Echo w/o potential etiology. Not orthostatic. No events on tele. No s/sx infection. No metabolic derangement. Evaluated by Cardiology  Polysubstance use UDS positive for cocaine, benzo, THC: recommended ongoing cessation efforts. Continues to smoke. Counseled by all providers  Consultations:  Dr Anne Fu cardiology  Discharge Exam: Vitals:   01/16/18 0823 01/16/18 1120  BP: 117/69 112/85  Pulse: (!) 58 68  Resp: 16 16  Temp: 97.8 F (36.6 C)   SpO2: 96% 99%   General: No acute respiratory distress Lungs: Clear to auscultation bilaterally without wheezes or crackles Cardiovascular: Regular rate and rhythm without murmur gallop or rub normal S1 and S2 Abdomen: Nontender, nondistended, soft, bowel sounds positive, no rebound, no ascites, no appreciable mass Extremities: No significant cyanosis, clubbing, or edema bilateral lower extremities  Allergies as of 01/16/2018      Reactions   Morphine And Related Itching      Medication List  TAKE these medications   aspirin 81 MG EC tablet Take 1 tablet (81 mg total) by mouth daily. Start taking on:  01/17/2018   atorvastatin 40 MG tablet Commonly known as:  LIPITOR Take 1 tablet (40 mg total) by mouth daily at 6 PM.   HYDROcodone-acetaminophen 5-325 MG tablet Commonly known as:  NORCO/VICODIN Take 1 tablet by mouth every 4  (four) hours as needed for moderate pain or severe pain (one for moderate pain, two for severe pain).   multivitamin with minerals Tabs tablet Take 1 tablet by mouth daily.   nicotine 21 mg/24hr patch Commonly known as:  NICODERM CQ - dosed in mg/24 hours Place 1 patch (21 mg total) onto the skin at bedtime.   polyethylene glycol packet Commonly known as:  MIRALAX / GLYCOLAX Take 17 g by mouth daily as needed for mild constipation.      Allergies  Allergen Reactions  . Morphine And Related Itching   Follow-up Information    Jake BatheSkains, Mark C, MD Follow up on 02/27/2018.   Specialty:  Cardiology Why:  Please arrive 15 minutes early for your 10:20am cardiology appointment Contact information: [address] [place] [id number] [phone]           Labs: Basic Metabolic Panel: Recent Labs  Lab 01/14/18 1450 01/15/18 0342  NA 139 139  K 3.7 4.0  CL 104 108  CO2 23 23  GLUCOSE 151* 99  BUN 15 20  CREATININE 1.21 1.23  CALCIUM 9.4 8.7*   CBC: Recent Labs  Lab 01/14/18 1450 01/15/18 0342  WBC 10.3 6.6  HGB 16.0 15.1  HCT 47.1 45.6  MCV 95.9 96.6  PLT 312 307   Cardiac Enzymes: Recent Labs  Lab 01/14/18 2354 01/15/18 0828  TROPONINI <0.03 <0.03    Signed:  Lonia BloodJeffrey T Josealberto Montalto MD.  [facility] 01/16/2018, 3:35 PM

## 2018-01-16 NOTE — Progress Notes (Signed)
Per MD McClung, Pt to stay as observation on 5C.

## 2018-01-17 MED ORDER — NICOTINE 21 MG/24HR TD PT24: 21.0000 mg | MEDICATED_PATCH | Freq: Every day | TRANSDERMAL | 0 refills | Status: AC

## 2018-01-17 MED ORDER — ATORVASTATIN CALCIUM 40 MG PO TABS: 40.0000 mg | ORAL_TABLET | Freq: Every day | ORAL | 0 refills | Status: AC

## 2018-01-17 NOTE — Discharge Summary (Signed)
Patient was held overnight by Dr. Sharon SellerMcClung.  No further issues, plan to d/c today.  Encouraged drug cessation. Instructed no driving.  Marlin CanaryJessica Vann DO

## 2018-02-27 ENCOUNTER — Ambulatory Visit: Payer: Self-pay | Admitting: Cardiology

## 2018-02-27 NOTE — Progress Notes (Deleted)
Cardiology Office Note:    Date:  02/27/2018   ID:  Chase [name], DOB [birth year], MRN [id number]  PCP:  System, Pcp Not In  Cardiologist:  Donato Schultz, MD  Electrophysiologist:  None   Referring MD: No ref. provider found     History of Present Illness:    Chase [name] is a 47 y.o. male here for follow-up of chest pain after recent hospitalization discharged on 01/16/2018.  Troponin was negative, CTA excluded aortic pathology.  He had a coronary CT which showed mild to moderate LAD disease and recommendation was to continue with aspirin and to start atorvastatin 40 mg his pain was thought to be likely musculoskeletal in the setting of cocaine use and chronic chest pain injury.  Also may have had syncope, this was his fourth episode.  Polysubstance abuse likely contributing.  Echo normal EF.  EKG normal.    Past Medical History:  Diagnosis Date  . Chest pain   . MI (myocardial infarction) (HCC)   . Polysubstance abuse (HCC)   . Syncope     No past surgical history on file.  Current Medications: No outpatient medications have been marked as taking for the 02/27/18 encounter (Appointment) with Chase Bathe, MD.     Allergies:   Morphine and related   Social History   Socioeconomic History  . Marital status: Divorced    Spouse name: Not on file  . Number of children: Not on file  . Years of education: Not on file  . Highest education level: Not on file  Occupational History  . Not on file  Social Needs  . Financial resource strain: Not on file  . Food insecurity:    Worry: Not on file    Inability: Not on file  . Transportation needs:    Medical: Not on file    Non-medical: Not on file  Tobacco Use  . Smoking status: Current Every Day Smoker    Packs/day: 1.00    Types: Cigarettes  . Smokeless tobacco: Never Used  Substance and Sexual Activity  . Alcohol use: Yes  . Drug use: Not on file  . Sexual activity: Not on file  Lifestyle  . Physical  activity:    Days per week: Not on file    Minutes per session: Not on file  . Stress: Not on file  Relationships  . Social connections:    Talks on phone: Not on file    Gets together: Not on file    Attends religious service: Not on file    Active member of club or organization: Not on file    Attends meetings of clubs or organizations: Not on file    Relationship status: Not on file  Other Topics Concern  . Not on file  Social History Narrative  . Not on file     Family History: The patient's ***family history includes COPD in his mother; Healthy in his brother and sister; Heart attack in his father; Heart disease in his father.  ROS:   Please see the history of present illness.    *** All other systems reviewed and are negative.  EKGs/Labs/Other Studies Reviewed:    The following studies were reviewed today: ***  EKG:  EKG is *** ordered today.  The ekg ordered today demonstrates ***  Recent Labs: 01/15/2018: BUN 20; Creatinine, Ser 1.23; Hemoglobin 15.1; Platelets 307; Potassium 4.0; Sodium 139; TSH 1.362  Recent Lipid Panel    Component Value Date/Time  CHOL 175 01/15/2018 0342   TRIG 151 (H) 01/15/2018 0342   HDL 27 (L) 01/15/2018 0342   CHOLHDL 6.5 01/15/2018 0342   VLDL 30 01/15/2018 0342   LDLCALC 118 (H) 01/15/2018 0342    Physical Exam:    VS:  There were no vitals taken for this visit.    Wt Readings from Last 3 Encounters:  01/15/18 175 lb (79.4 kg)  10/12/15 174 lb 6.1 oz (79.1 kg)  11/10/14 167 lb (75.8 kg)     GEN: *** Well nourished, well developed in no acute distress HEENT: Normal NECK: No JVD; No carotid bruits LYMPHATICS: No lymphadenopathy CARDIAC: ***RRR, no murmurs, rubs, gallops RESPIRATORY:  Clear to auscultation without rales, wheezing or rhonchi  ABDOMEN: Soft, non-tender, non-distended MUSCULOSKELETAL:  No edema; No deformity  SKIN: Warm and dry NEUROLOGIC:  Alert and oriented x 3 PSYCHIATRIC:  Normal affect    ASSESSMENT:    No diagnosis found. PLAN:    In order of problems listed above:  Moderate nonobstructive coronary artery disease of the LAD -This was seen on coronary CTA.  Continue with aggressive secondary risk factor prevention including smoking cessation, cocaine cessation.  Recommend using aspirin statin.  Avoiding beta-blocker because of prior cocaine use.  Polysubstance abuse - Hospitalization positive for cocaine benzo THC.  Recommend ongoing cessation efforts.  Prior syncope - Unremarkable cardiology work-up.  Likely related to polysubstance abuse.   Medication Adjustments/Labs and Tests Ordered: Current medicines are reviewed at length with the patient today.  Concerns regarding medicines are outlined above.  No orders of the defined types were placed in this encounter.  No orders of the defined types were placed in this encounter.   There are no Patient Instructions on file for this visit.   Signed, Donato Schultz, MD  02/27/2018 10:10 AM    [facility]

## 2021-09-16 ENCOUNTER — Emergency Department (HOSPITAL_COMMUNITY)
Admission: EM | Admit: 2021-09-16 | Discharge: 2021-09-16 | Disposition: A | Discharge status: Home / Self Care | Payer: Self-pay | Attending: Emergency Medicine | Admitting: Emergency Medicine

## 2021-09-16 ENCOUNTER — Other Ambulatory Visit: Payer: Self-pay

## 2021-09-16 ENCOUNTER — Emergency Department (HOSPITAL_COMMUNITY): Payer: Self-pay

## 2021-09-16 DIAGNOSIS — W3189XA Contact with other specified machinery, initial encounter: Secondary | ICD-10-CM | POA: Insufficient documentation

## 2021-09-16 DIAGNOSIS — Z7982 Long term (current) use of aspirin: Secondary | ICD-10-CM | POA: Insufficient documentation

## 2021-09-16 DIAGNOSIS — S51812A Laceration without foreign body of left forearm, initial encounter: Secondary | ICD-10-CM | POA: Insufficient documentation

## 2021-09-16 DIAGNOSIS — Z23 Encounter for immunization: Secondary | ICD-10-CM | POA: Insufficient documentation

## 2021-09-16 MED ORDER — CLINDAMYCIN HCL 150 MG PO CAPS: 150.0000 mg | ORAL_CAPSULE | Freq: Three times a day (TID) | ORAL | 0 refills | Status: AC

## 2021-09-16 MED ORDER — CLINDAMYCIN HCL 150 MG PO CAPS
300.0000 mg | ORAL_CAPSULE | Freq: Once | ORAL | Status: AC
  Administered 2021-09-16: 300 mg via ORAL
  Filled 2021-09-16: qty 2

## 2021-09-16 MED ORDER — TETANUS-DIPHTH-ACELL PERTUSSIS 5-2.5-18.5 LF-MCG/0.5 IM SUSY
0.5000 mL | PREFILLED_SYRINGE | Freq: Once | INTRAMUSCULAR | Status: AC
  Administered 2021-09-16: 0.5 mL via INTRAMUSCULAR
  Filled 2021-09-16: qty 0.5

## 2021-09-16 MED ORDER — HYDROMORPHONE HCL 1 MG/ML IJ SOLN
1.0000 mg | Freq: Once | INTRAMUSCULAR | Status: AC
  Administered 2021-09-16: 1 mg via INTRAVENOUS
  Filled 2021-09-16: qty 1

## 2021-09-16 MED ORDER — FENTANYL CITRATE PF 50 MCG/ML IJ SOSY
50.0000 ug | PREFILLED_SYRINGE | Freq: Once | INTRAMUSCULAR | Status: AC
  Administered 2021-09-16: 50 ug via INTRAVENOUS
  Filled 2021-09-16: qty 1

## 2021-09-16 MED ORDER — HYDROCODONE-ACETAMINOPHEN 5-325 MG PO TABS: 1.0000 | ORAL_TABLET | Freq: Four times a day (QID) | ORAL | 0 refills | Status: DC | PRN

## 2021-09-16 MED ORDER — LIDOCAINE HCL (PF) 1 % IJ SOLN
30.0000 mL | Freq: Once | INTRAMUSCULAR | Status: AC
  Administered 2021-09-16: 30 mL via INTRADERMAL
  Filled 2021-09-16: qty 30

## 2021-09-16 NOTE — Consult Note (Signed)
Reason for Consult:Left FA lac Referring Physician: Carmin [name] Time called: [id number] Time at bedside: [place] [name] is an 51 y.o. male.  HPI: Chase was working with a Physiological scientist when the wheel broke and hit his left forearm. He had a large laceration and came to the ED for evaluation. He had some weakness in his thumb and hand surgery was consulted. He is RHD.  Past Medical History:  Diagnosis Date   Chest pain    MI (myocardial infarction) ([name])    Polysubstance abuse ([place])    Syncope     No past surgical history on file.  Family History  Problem Relation Age of Onset   COPD Mother    Heart attack Father    Heart disease Father        CABG   Healthy Sister    Healthy Brother     Social History:  reports that he has been smoking cigarettes. He has been smoking an average of 1 pack per day. He has never used smokeless tobacco. He reports current alcohol use. No history on file for drug use.  Allergies:  Allergies  Allergen Reactions   Morphine And Related Itching   Penicillins     Medications: I have reviewed the patient's current medications.  No results found for this or any previous visit (from the past 48 hour(s)).  No results found.  Review of Systems  HENT:  Negative for ear discharge, ear pain, hearing loss and tinnitus.   Eyes:  Negative for photophobia and pain.  Respiratory:  Negative for cough and shortness of breath.   Cardiovascular:  Negative for chest pain.  Gastrointestinal:  Negative for abdominal pain, nausea and vomiting.  Genitourinary:  Negative for dysuria, flank pain, frequency and urgency.  Musculoskeletal:  Positive for arthralgias (Left FA). Negative for back pain, myalgias and neck pain.  Neurological:  Negative for dizziness and headaches.  Hematological:  Does not bruise/bleed easily.  Psychiatric/Behavioral:  The patient is not nervous/anxious.   Blood pressure (!) 145/90, pulse 77, temperature (!) 97.5 F (36.4 C),  temperature source Oral, resp. rate 18, height 5\' 7"  (1.702 m), weight 77.1 kg, SpO2 96 %. Physical Exam Constitutional:      General: He is not in acute distress.    Appearance: He is well-developed. He is not diaphoretic.  HENT:     Head: Normocephalic and atraumatic.  Eyes:     General: No scleral icterus.       Right eye: No discharge.        Left eye: No discharge.     Conjunctiva/sclera: Conjunctivae normal.  Cardiovascular:     Rate and Rhythm: Normal rate and regular rhythm.  Pulmonary:     Effort: Pulmonary effort is normal. No respiratory distress.  Musculoskeletal:     Cervical back: Normal range of motion.     Comments: Left shoulder, elbow, wrist, digits- Laceration to prox lat FA, some muscle involvement, hemostatic, severe TTP, no instability, no blocks to motion  Sens  Ax/R/M/U intact  Mot   Ax/ R/ PIN/ M/ AIN/ U intact but thumb adduction/opposition weak  Rad 2+  Skin:    General: Skin is warm and dry.  Neurological:     Mental Status: He is alert.  Psychiatric:        Mood and Affect: Mood normal.        Behavior: Behavior normal.    Assessment/Plan: Left FA lac -- I suspect the weakness is  pain mediated. Will ask EDP to I&D and close. He should f/u with Dr. [name] early next week to reassess fxn and see if formal wound exploration needed.    Lisette Abu, PA-C Orthopedic Surgery [phone] 09/16/2021, 4:09 PM

## 2021-09-16 NOTE — Discharge Instructions (Signed)
As discussed, with your forearm laceration it is important to monitor your condition carefully.  Do not hesitate to return here for concerning changes.  Otherwise follow-up with our orthopedic surgery colleague next week.

## 2021-09-16 NOTE — ED Notes (Signed)
Patient verbalizes understanding of discharge instructions. Opportunity for questioning and answers were provided. Armband removed by staff, pt discharged from ED.  

## 2021-09-16 NOTE — ED Triage Notes (Addendum)
Pt bib [facility] for an approx 3 inch laceration on L forearm from an angle grinder. No arterial bleeding. No numbness. Pulses palpable and pt is able to move all fingers. VSS per ems.

## 2021-09-16 NOTE — ED Provider Notes (Signed)
[facility]   CSN: [id number] Arrival date & time: 09/16/21  1527     History  Chief Complaint  Patient presents with   Laceration    Chase [name] is a 51 y.o. male.  HPI Patient presents with forearm laceration.  He is initially alone, but is joined by his brother.  Arrives via EMS.  He notes that he is using an open wheel cutting tool on his steel bumper, when the wheel itself broke, and he sustained a laceration to his left forearm.  Since that time he has had severe pain in that area, but ability to move the wrist and fingers in a seemingly normal manner.  No other injuries, no other complaints.    Home Medications Prior to Admission medications   Medication Sig Start Date End Date Taking? Authorizing Provider  aspirin EC 81 MG EC tablet Take 1 tablet (81 mg total) by mouth daily. 01/17/18   Lonia BloodMcClung, Jeffrey T, MD  atorvastatin (LIPITOR) 40 MG tablet Take 1 tablet (40 mg total) by mouth daily at 6 PM. 01/17/18   Joseph ArtVann, Jessica U, DO  HYDROcodone-acetaminophen (NORCO/VICODIN) 5-325 MG tablet Take 1 tablet by mouth every 6 (six) hours as needed for moderate pain or severe pain (one for moderate pain, two for severe pain). 09/16/21   Gerhard MunchLockwood, Wilma Michaelson, MD  Multiple Vitamin (MULTIVITAMIN WITH MINERALS) TABS tablet Take 1 tablet by mouth daily.    [provider]  nicotine (NICODERM CQ - DOSED IN MG/24 HOURS) 21 mg/24hr patch Place 1 patch (21 mg total) onto the skin at bedtime. 01/17/18   Joseph ArtVann, Jessica U, DO  polyethylene glycol (MIRALAX / GLYCOLAX) packet Take 17 g by mouth daily as needed for mild constipation. Patient not taking: Reported on 01/15/2018 10/14/15   Hartley Barefootegalado, Belkys A, MD      Allergies    Morphine and related and Penicillins    Review of Systems   Review of Systems  All other systems reviewed and are negative.  Physical Exam Updated Vital Signs BP 124/80   Pulse 72   Temp (!) 97.5 F (36.4 C)  (Oral)   Resp 20   Ht 5\' 7"  (1.702 m)   Wt 77.1 kg   SpO2 96%   BMI 26.63 kg/m  Physical Exam Vitals and nursing note reviewed.  Constitutional:      General: He is not in acute distress.    Appearance: He is well-developed.  HENT:     Head: Normocephalic and atraumatic.  Eyes:     Conjunctiva/sclera: Conjunctivae normal.  Cardiovascular:     Rate and Rhythm: Normal rate and regular rhythm.  Pulmonary:     Effort: Pulmonary effort is normal. No respiratory distress.     Breath sounds: No stridor.  Abdominal:     General: There is no distension.  Musculoskeletal:       Arms:  Skin:    General: Skin is warm and dry.  Neurological:     Mental Status: He is alert and oriented to person, place, and time.    ED Results / Procedures / Treatments   Labs (all labs ordered are listed, but only abnormal results are displayed) Labs Reviewed - No data to display  EKG None  Radiology DG Forearm Left  Result Date: 09/16/2021 CLINICAL DATA:  Traumatic laceration, full depth. Question bone involvement. EXAM: LEFT FOREARM - 2 VIEW COMPARISON:  None FINDINGS: Soft tissue injury of the mid forearm. No evidence of  fracture or cortical divot. 2 mm metallic density foreign object within the soft tissues. IMPRESSION: Soft tissue injury. No abnormal bone finding. 2 mm metallic density foreign object in the region of the laceration. Electronically Signed   By: Paulina Fusi M.D.   On: 09/16/2021 16:36    Procedures .Marland KitchenLaceration Repair  Date/Time: 09/16/2021 6:13 PM Performed by: Gerhard Munch, MD Authorized by: Gerhard Munch, MD   Consent:    Consent obtained:  Verbal   Consent given by:  Patient   Risks, benefits, and alternatives were discussed: yes     Risks discussed:  Infection, pain, retained foreign body, tendon damage, vascular damage, poor wound healing, poor cosmetic result, need for additional repair and nerve damage   Alternatives discussed:  Delayed  treatment Universal protocol:    Procedure explained and questions answered to patient or proxy's satisfaction: yes     Imaging studies available: yes     Required blood products, implants, devices, and special equipment available: yes     Site/side marked: yes     Immediately prior to procedure, a time out was called: yes     Patient identity confirmed:  Verbally with patient Anesthesia:    Anesthesia method:  Local infiltration   Local anesthetic:  Lidocaine 1% w/o epi Laceration details:    Location:  Shoulder/arm   Shoulder/arm location:  L lower arm   Length (cm):  12   Depth (mm):  4 Pre-procedure details:    Preparation:  Patient was prepped and draped in usual sterile fashion and imaging obtained to evaluate for foreign bodies Exploration:    Limited defect created (wound extended): no     Imaging obtained: x-ray     Imaging outcome: foreign body noted     Wound exploration: wound explored through full range of motion and entire depth of wound visualized     Wound extent: fascia violated, muscle damage, nerve damage (nerve visualized, patient denies numbness) and vascular damage     Wound extent: no underlying fracture noted   Treatment:    Area cleansed with:  Povidone-iodine   Amount of cleaning:  Extensive   Irrigation solution:  Sterile saline   Irrigation volume:  500   Irrigation method:  Syringe   Visualized foreign bodies/material removed: no     Debridement:  None   Undermining:  None   Scar revision: no   Skin repair:    Repair method:  Staples   Number of staples:  9 Approximation:    Approximation:  Close Repair type:    Repair type:  Complex (eval of muscle involvement and probe for FB) Post-procedure details:    Dressing:  Antibiotic ointment and splint for protection   Procedure completion:  Tolerated    Medications Ordered in ED Medications  fentaNYL (SUBLIMAZE) injection 50 mcg (50 mcg Intravenous Given 09/16/21 1600)  lidocaine (PF)  (XYLOCAINE) 1 % injection 30 mL (30 mLs Intradermal Given by Other 09/16/21 1640)  HYDROmorphone (DILAUDID) injection 1 mg (1 mg Intravenous Given 09/16/21 1650)    ED Course/ Medical Decision Making/ A&P This patient presents to the ED for concern of laceration to the forearm, this involves an extensive number of treatment options, and is a complaint that carries with it a high risk of complications and morbidity.    The differential diagnosis includes nerve damage, muscle or tendon damage bone involvement   Social Determinants of Health:  No limits  Additional history obtained:  Additional history and/or information obtained from brother,  EMS, notable for brother for history of injury, EMS for vital signs in transport, vital signs unremarkable.   After the initial evaluation, orders, including: X-ray, antibiotics, IV narcotics, wound repair were initiated.   Patient placed on Cardiac and Pulse-Oximetry Monitors. The patient was maintained on a cardiac monitor.  The cardiac monitored showed an rhythm of 70 sinus normal The patient was also maintained on pulse oximetry. The readings were typically 100% room air normal   On repeat evaluation of the patient improved   Imaging Studies ordered:  I independently visualized and interpreted imaging which showed possible 2 mm foreign body mid lesion I agree with the radiologist interpretation  Consultations Obtained:  I requested consultation with the orthopedics,  and discussed lab and imaging findings as well as pertinent plan - they recommend: Closure of the wound, Velcro splint, outpatient follow-up with consideration of reevaluation as needed  Dispostion / Final MDM:  After consideration of the diagnostic results and the patient's response to treatment, patient had closure of his wound after copious amounts of irrigation, and investigation without observed foreign body, some suspicion this may have been irrigated out versus is  deeper.  Patient had no worsening of his neurologic condition, was ready for discharge, discharged in stable condition.  Final Clinical Impression(s) / ED Diagnoses Final diagnoses:  Laceration of left forearm, initial encounter    Rx / DC Orders ED Discharge Orders          Ordered    HYDROcodone-acetaminophen (NORCO/VICODIN) 5-325 MG tablet  Every 6 hours PRN,   Status:  Discontinued        09/16/21 1758    HYDROcodone-acetaminophen (NORCO/VICODIN) 5-325 MG tablet  Every 6 hours PRN        09/16/21 1759              Gerhard Munch, MD 09/16/21 1816

## 2022-03-08 ENCOUNTER — Emergency Department
Admission: EM | Admit: 2022-03-08 | Discharge: 2022-03-08 | Disposition: A | Discharge status: Home / Self Care | Payer: Self-pay | Attending: Emergency Medicine | Admitting: Emergency Medicine

## 2022-03-08 ENCOUNTER — Encounter: Payer: Self-pay | Admitting: Emergency Medicine

## 2022-03-08 ENCOUNTER — Other Ambulatory Visit: Payer: Self-pay

## 2022-03-08 ENCOUNTER — Emergency Department: Payer: Self-pay

## 2022-03-08 DIAGNOSIS — X58XXXA Exposure to other specified factors, initial encounter: Secondary | ICD-10-CM | POA: Insufficient documentation

## 2022-03-08 DIAGNOSIS — S6992XA Unspecified injury of left wrist, hand and finger(s), initial encounter: Secondary | ICD-10-CM

## 2022-03-08 DIAGNOSIS — S60512A Abrasion of left hand, initial encounter: Secondary | ICD-10-CM | POA: Insufficient documentation

## 2022-03-08 MED ORDER — TRAMADOL HCL 50 MG PO TABS: 50.0000 mg | ORAL_TABLET | Freq: Four times a day (QID) | ORAL | 0 refills | Status: AC | PRN
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
## 2022-03-08 NOTE — ED Triage Notes (Signed)
Pt here after crushing his left  hand under a truck. Pt states his hand is continuing to bleed, hand wrapped in kerlix.
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
## 2023-07-04 ENCOUNTER — Ambulatory Visit: Payer: Self-pay
